# Patient Record
Sex: Female | Born: 1945 | Race: White | Hispanic: No | Smoking: Never smoker
Health system: Southern US, Community
[De-identification: ages and names within clinical notes are randomized; demographics above are authoritative.]

## PROBLEM LIST (undated history)

## (undated) DIAGNOSIS — I10 Essential (primary) hypertension: Secondary | ICD-10-CM

## (undated) DIAGNOSIS — T8859XA Other complications of anesthesia, initial encounter: Secondary | ICD-10-CM

## (undated) DIAGNOSIS — T4145XA Adverse effect of unspecified anesthetic, initial encounter: Secondary | ICD-10-CM

## (undated) DIAGNOSIS — I8390 Asymptomatic varicose veins of unspecified lower extremity: Secondary | ICD-10-CM

## (undated) DIAGNOSIS — M1990 Unspecified osteoarthritis, unspecified site: Secondary | ICD-10-CM

## (undated) HISTORY — PX: WISDOM TOOTH EXTRACTION: SHX21

## (undated) HISTORY — PX: APPENDECTOMY: SHX54

## (undated) HISTORY — PX: CATARACT EXTRACTION, BILATERAL: SHX1313

## (undated) HISTORY — PX: TUBAL LIGATION: SHX77

---

## 2001-02-08 ENCOUNTER — Inpatient Hospital Stay (HOSPITAL_COMMUNITY): Admission: AD | Admit: 2001-02-08 | Discharge: 2001-02-09 | Payer: Self-pay | Admitting: Cardiovascular Disease

## 2001-02-09 ENCOUNTER — Encounter: Payer: Self-pay | Admitting: Cardiovascular Disease

## 2010-08-16 ENCOUNTER — Other Ambulatory Visit: Payer: Self-pay | Admitting: Family Medicine

## 2010-08-16 DIAGNOSIS — Z1231 Encounter for screening mammogram for malignant neoplasm of breast: Secondary | ICD-10-CM

## 2010-08-26 ENCOUNTER — Ambulatory Visit
Admission: RE | Admit: 2010-08-26 | Discharge: 2010-08-26 | Disposition: A | Discharge status: Home / Self Care | Payer: Medicare Other | Source: Ambulatory Visit | Attending: Family Medicine | Admitting: Family Medicine

## 2010-08-26 DIAGNOSIS — Z1231 Encounter for screening mammogram for malignant neoplasm of breast: Secondary | ICD-10-CM

## 2013-05-03 ENCOUNTER — Encounter (HOSPITAL_COMMUNITY): Payer: Self-pay | Admitting: Emergency Medicine

## 2013-05-03 ENCOUNTER — Emergency Department (HOSPITAL_COMMUNITY): Payer: Medicare Other

## 2013-05-03 ENCOUNTER — Emergency Department (HOSPITAL_COMMUNITY)
Admission: EM | Admit: 2013-05-03 | Discharge: 2013-05-03 | Disposition: A | Discharge status: Home / Self Care | Payer: Medicare Other | Attending: Emergency Medicine | Admitting: Emergency Medicine

## 2013-05-03 DIAGNOSIS — H8110 Benign paroxysmal vertigo, unspecified ear: Secondary | ICD-10-CM | POA: Insufficient documentation

## 2013-05-03 DIAGNOSIS — I10 Essential (primary) hypertension: Secondary | ICD-10-CM | POA: Insufficient documentation

## 2013-05-03 DIAGNOSIS — Z7982 Long term (current) use of aspirin: Secondary | ICD-10-CM | POA: Insufficient documentation

## 2013-05-03 DIAGNOSIS — Z79899 Other long term (current) drug therapy: Secondary | ICD-10-CM | POA: Insufficient documentation

## 2013-05-03 DIAGNOSIS — H9319 Tinnitus, unspecified ear: Secondary | ICD-10-CM | POA: Insufficient documentation

## 2013-05-03 DIAGNOSIS — M6281 Muscle weakness (generalized): Secondary | ICD-10-CM | POA: Insufficient documentation

## 2013-05-03 HISTORY — DX: Essential (primary) hypertension: I10

## 2013-05-03 LAB — CBC
HEMATOCRIT: 36.7 % (ref 36.0–46.0)
HEMOGLOBIN: 12.1 g/dL (ref 12.0–15.0)
MCH: 29.6 pg (ref 26.0–34.0)
MCHC: 33 g/dL (ref 30.0–36.0)
MCV: 89.7 fL (ref 78.0–100.0)
Platelets: 285 10*3/uL (ref 150–400)
RBC: 4.09 MIL/uL (ref 3.87–5.11)
RDW: 12.7 % (ref 11.5–15.5)
WBC: 6.6 10*3/uL (ref 4.0–10.5)

## 2013-05-03 LAB — BASIC METABOLIC PANEL
BUN: 12 mg/dL (ref 6–23)
CO2: 24 mEq/L (ref 19–32)
Calcium: 9.7 mg/dL (ref 8.4–10.5)
Chloride: 105 mEq/L (ref 96–112)
Creatinine, Ser: 0.83 mg/dL (ref 0.50–1.10)
GFR calc Af Amer: 82 mL/min — ABNORMAL LOW (ref >90)
GFR calc non Af Amer: 70 mL/min — ABNORMAL LOW (ref >90)
Glucose, Bld: 94 mg/dL (ref 70–99)
Potassium: 4 mEq/L (ref 3.7–5.3)
Sodium: 142 mEq/L (ref 137–147)

## 2013-05-03 LAB — I-STAT TROPONIN, ED: Troponin i, poc: 0 ng/mL (ref 0.00–0.08)

## 2013-05-03 MED ORDER — MECLIZINE HCL 25 MG PO TABS: 25.0000 mg | ORAL_TABLET | Freq: Three times a day (TID) | ORAL | Status: DC | PRN

## 2013-05-03 MED ORDER — MECLIZINE HCL 25 MG PO TABS
25.0000 mg | ORAL_TABLET | Freq: Once | ORAL | Status: AC
  Administered 2013-05-03: 25 mg via ORAL
  Filled 2013-05-03: qty 1

## 2013-05-03 NOTE — ED Notes (Signed)
Pt ambulating independently w/ steady gait on d/c in no acute distress, A&Ox4. D/c instructions reviewed w/ pt and family - pt and family deny any further questions or concerns at present. Rx given x1  

## 2013-05-03 NOTE — ED Notes (Signed)
Pt reports that left ear is hurting her, and she has been feeling dizzy. States that she woke up last night with the pain and had weakness on the left side. States she feels like she is off balance.

## 2013-05-03 NOTE — ED Notes (Signed)
MD at bedside. 

## 2013-05-03 NOTE — ED Provider Notes (Signed)
CSN: [id number]     Arrival date & time 05/03/13  1515 History   First MD Initiated Contact with Patient 05/03/13 1801     Chief Complaint  Patient presents with  . Dizziness     (Consider location/radiation/quality/duration/timing/severity/associated sxs/prior Treatment) HPI Comments: Pt with cc: of dizziness. States last night she woke up and room was spinning. Still able to walk. Felt better, and then wen tback to bed. Woke up today and states she has had off and on dizziness, also has had some L ear ringing. Pt states she feels a little off balance and "weak" but not focally. No sensation deficits. Still able to walk. No hx of prior. No vision troubles.   Patient is a 68 y.o. female presenting with dizziness. The history is provided by the patient.  Dizziness Quality:  Vertigo Severity:  Mild Onset quality:  Sudden Duration:  1 day Timing:  Intermittent Progression:  Waxing and waning Chronicity:  New Context: ear pain and standing up   Context: not when bending over, not with head movement and not with inactivity   Relieved by:  Nothing Worsened by:  Standing up Ineffective treatments:  None tried Associated symptoms: tinnitus and weakness (feels a little weak on L side)   Associated symptoms: no blood in stool, no chest pain, no headaches, no nausea, no shortness of breath, no syncope and no vomiting   Risk factors: no hx of stroke, no hx of vertigo and no new medications     Past Medical History  Diagnosis Date  . Hypertension    Past Surgical History  Procedure Laterality Date  . Appendectomy     No family history on file. History  Substance Use Topics  . Smoking status: Never Smoker   . Smokeless tobacco: Not on file  . Alcohol Use: No   OB History   Grav Para Term Preterm Abortions TAB SAB Ect Mult Living                 Review of Systems  Constitutional: Negative for fever, activity change and appetite change.  HENT: Positive for tinnitus. Negative for  congestion and rhinorrhea.   Eyes: Negative for discharge, redness and itching.  Respiratory: Negative for shortness of breath.   Cardiovascular: Negative for chest pain and syncope.  Gastrointestinal: Negative for nausea, vomiting and blood in stool.  Skin: Negative for rash.  Neurological: Positive for dizziness and weakness. Negative for seizures, syncope, numbness and headaches.      Allergies  Review of patient's allergies indicates no known allergies.  Home Medications   Current Outpatient Rx  Name  Route  Sig  Dispense  Refill  . BAYER ASPIRIN PO   Oral   Take 1 tablet by mouth daily. Total of 800mg          . furosemide (LASIX) 20 MG tablet   Oral   Take 20 mg by mouth daily as needed.         Marland Kitchen levothyroxine (SYNTHROID, LEVOTHROID) 75 MCG tablet   Oral   Take 75 mcg by mouth daily before breakfast.         . metoprolol (LOPRESSOR) 100 MG tablet   Oral   Take 100 mg by mouth daily.         . Omega-3 Fatty Acids (OMEGA 3 PO)   Oral   Take by mouth.         . meclizine (ANTIVERT) 25 MG tablet   Oral   Take 1 tablet (  25 mg total) by mouth 3 (three) times daily as needed for dizziness.   60 tablet   0    BP 155/69  Pulse 58  Temp(Src) 98.1 F (36.7 C) (Oral)  Resp 16  Wt 227 lb 3 oz (103.052 kg)  SpO2 100% Physical Exam  Constitutional: She is oriented to person, place, and time. She appears well-developed and well-nourished. No distress.  HENT:  Head: Normocephalic and atraumatic.  Mouth/Throat: Oropharynx is clear and moist.  Eyes: Conjunctivae and EOM are normal. Pupils are equal, round, and reactive to light. Right eye exhibits no discharge. Left eye exhibits no discharge. No scleral icterus.  Neck: Normal range of motion. Neck supple.  Cardiovascular: Normal rate, regular rhythm and intact distal pulses.  Exam reveals no gallop and no friction rub.   No murmur heard. Pulmonary/Chest: Effort normal and breath sounds normal. No respiratory  distress. She has no wheezes. She has no rales.  Abdominal: Soft. She exhibits no distension and no mass. There is no tenderness.  Musculoskeletal: Normal range of motion.  Neurological: She is alert and oriented to person, place, and time. No cranial nerve deficit. She exhibits normal muscle tone. Coordination normal.  5/5 strength in all exts, normal sensation in all exts, 2+ DTRs in patella and brachioradilias b/l, F2N negative, Romberg negative, able to ambulate without ataxia  Skin: She is not diaphoretic.    ED Course  Procedures (including critical care time) Labs Review Labs Reviewed  BASIC METABOLIC PANEL - Abnormal; Notable for the following:    GFR calc non Af Amer 70 (*)    GFR calc Af Amer 82 (*)    All other components within normal limits  CBC  I-STAT TROPOININ, ED   Imaging Review Ct Head Wo Contrast  05/03/2013   CLINICAL DATA:  Feeling dizzy and left ear is hurting.  EXAM: CT HEAD WITHOUT CONTRAST  TECHNIQUE: Contiguous axial images were obtained from the base of the skull through the vertex without contrast.  COMPARISON:  None  FINDINGS: The cerebellar tonsils appear to be low-lying and this could represent a Chiari 1 malformation. Otherwise, normal appearance of the intracranial structures. No evidence for acute hemorrhage, mass lesion, midline shift, hydrocephalus or large infarct. The visualized sinuses are clear. No acute bone abnormality.  IMPRESSION: No acute intracranial abnormality.  The cerebellar tonsils may be low lying and a Chiari 1 malformation cannot be excluded. This could be further evaluated with MRI.   Electronically Signed   By: Richarda Overlie M.D.   On: 05/03/2013 16:40   Mr Brain Wo Contrast  05/03/2013   CLINICAL DATA:  Dizziness starting today  EXAM: MRI HEAD WITHOUT CONTRAST  TECHNIQUE: Multiplanar, multiecho pulse sequences of the brain and surrounding structures were obtained without intravenous contrast.  COMPARISON:  05/03/2013  FINDINGS: Calvarium  and upper cervical spine: No marrow signal abnormality. No findings in the temporal bone to explain dizziness.  Orbits: Bilateral cataract resection.  Sinuses: Clear. Mastoid and middle ears are clear.  Brain: No acute abnormality such as acute infarct (a punctate DWI hyperintensity in the subcortical right frontal region is shine through based on ADC map), hemorrhage, hydrocephalus, or mass lesion. No evidence of large vessel occlusion.  The cerebellar tonsils project 6 mm per the foramen magnum, but there is no significant crowding. Few scattered T2 and FLAIR hyperintensities in the bilateral cerebral white matter, compatible with remote small vessel ischemic injuries, common for age.  IMPRESSION: Negative brain MRI.  No findings to  explain dizziness.   Electronically Signed   By: Tiburcio PeaJonathan  Watts M.D.   On: 05/03/2013 21:40     EKG Interpretation None      MDM   MDM: 68 y.o. AAF w/ PMHx of HTN w/ cc: of vertigo. Sudden onset last night and intermittent since then. Also with feeling off balance, mild subj weakness. Currently with some dizziness. AFVSS, well appaering, NAD, no neuro deficits. Able to ambulate. With vertigo in 68 y.o. AAF w/ risk factors, will check labs, and MRI. Labs WNL. Pt given Antivert. MRI shows no acute process, specifically, no stroke, no mass. Pt with resolution of sxs with Antivert. Likely benign process. Will discharge with Antivert rx and to follow up with PCP as needed. Discharged. Care of case d/w my attending.  Final diagnoses:  BPPV (benign paroxysmal positional vertigo)    Discharged   Pilar Jarvisoug Kurk Corniel, MD 05/03/13 2330

## 2013-05-03 NOTE — Discharge Instructions (Signed)

## 2013-05-08 NOTE — ED Provider Notes (Signed)
I saw and evaluated the patient, reviewed the resident's note and I agree with the findings and plan.   EKG Interpretation   Date/Time:  Friday May 03 2013 15:36:09 EDT Ventricular Rate:  56 PR Interval:  138 QRS Duration: 94 QT Interval:  428 QTC Calculation: 413 R Axis:   47 Text Interpretation:  Sinus bradycardia Nonspecific ST abnormality  Abnormal ECG ED PHYSICIAN INTERPRETATION AVAILABLE IN [facility]  Confirmed by TEST, Record ([id number]) on 05/05/2013 12:51:19 PM     Patient with vertiginous symptoms that are mild in nature. She has had resolution with meclizine here in the ER and her workup is negative. Discharge with close followup with PCP.   Gilda Creasehristopher J. Pollina, MD 05/08/13 [id number]

## 2013-08-05 ENCOUNTER — Other Ambulatory Visit: Payer: Self-pay | Admitting: Internal Medicine

## 2013-08-05 DIAGNOSIS — R1900 Intra-abdominal and pelvic swelling, mass and lump, unspecified site: Secondary | ICD-10-CM

## 2013-08-08 ENCOUNTER — Ambulatory Visit
Admission: RE | Admit: 2013-08-08 | Discharge: 2013-08-08 | Disposition: A | Discharge status: Home / Self Care | Payer: Medicare Other | Source: Ambulatory Visit | Attending: Internal Medicine | Admitting: Internal Medicine

## 2013-08-08 DIAGNOSIS — R1900 Intra-abdominal and pelvic swelling, mass and lump, unspecified site: Secondary | ICD-10-CM

## 2013-09-09 ENCOUNTER — Encounter (HOSPITAL_COMMUNITY): Payer: Self-pay | Admitting: Pharmacist

## 2013-09-10 ENCOUNTER — Encounter (HOSPITAL_COMMUNITY)
Admission: RE | Admit: 2013-09-10 | Discharge: 2013-09-10 | Disposition: A | Discharge status: Home / Self Care | Payer: Medicare Other | Source: Ambulatory Visit | Attending: Obstetrics & Gynecology | Admitting: Obstetrics & Gynecology

## 2013-09-10 ENCOUNTER — Encounter (HOSPITAL_COMMUNITY): Payer: Self-pay

## 2013-09-10 ENCOUNTER — Other Ambulatory Visit: Payer: Self-pay | Admitting: Obstetrics & Gynecology

## 2013-09-10 DIAGNOSIS — R9389 Abnormal findings on diagnostic imaging of other specified body structures: Secondary | ICD-10-CM | POA: Diagnosis not present

## 2013-09-10 DIAGNOSIS — N8501 Benign endometrial hyperplasia: Secondary | ICD-10-CM | POA: Diagnosis not present

## 2013-09-10 DIAGNOSIS — I8390 Asymptomatic varicose veins of unspecified lower extremity: Secondary | ICD-10-CM | POA: Diagnosis not present

## 2013-09-10 DIAGNOSIS — N840 Polyp of corpus uteri: Secondary | ICD-10-CM | POA: Diagnosis not present

## 2013-09-10 DIAGNOSIS — I10 Essential (primary) hypertension: Secondary | ICD-10-CM | POA: Diagnosis not present

## 2013-09-10 HISTORY — DX: Unspecified osteoarthritis, unspecified site: M19.90

## 2013-09-10 HISTORY — DX: Adverse effect of unspecified anesthetic, initial encounter: T41.45XA

## 2013-09-10 HISTORY — DX: Other complications of anesthesia, initial encounter: T88.59XA

## 2013-09-10 HISTORY — DX: Asymptomatic varicose veins of unspecified lower extremity: I83.90

## 2013-09-10 LAB — CBC
HEMATOCRIT: 36.5 % (ref 36.0–46.0)
HEMOGLOBIN: 12 g/dL (ref 12.0–15.0)
MCH: 29.7 pg (ref 26.0–34.0)
MCHC: 32.9 g/dL (ref 30.0–36.0)
MCV: 90.3 fL (ref 78.0–100.0)
Platelets: 281 10*3/uL (ref 150–400)
RBC: 4.04 MIL/uL (ref 3.87–5.11)
RDW: 12.7 % (ref 11.5–15.5)
WBC: 8.5 10*3/uL (ref 4.0–10.5)

## 2013-09-10 LAB — BASIC METABOLIC PANEL
Anion gap: 10 (ref 5–15)
BUN: 17 mg/dL (ref 6–23)
CALCIUM: 9.4 mg/dL (ref 8.4–10.5)
CO2: 24 meq/L (ref 19–32)
CREATININE: 0.94 mg/dL (ref 0.50–1.10)
Chloride: 105 mEq/L (ref 96–112)
GFR calc Af Amer: 70 mL/min — ABNORMAL LOW (ref >90)
GFR calc non Af Amer: 61 mL/min — ABNORMAL LOW (ref >90)
GLUCOSE: 101 mg/dL — AB (ref 70–99)
Potassium: 4.5 mEq/L (ref 3.7–5.3)
Sodium: 139 mEq/L (ref 137–147)

## 2013-09-10 NOTE — Patient Instructions (Addendum)
Your procedure is scheduled on: Wednesday, September 11, 2013  [facility]: 9:00am  Pick up the phone at the desk and dial [phone].  Call this number if you have problems the morning of surgery: [phone].  Remember: Do NOT eat food: After midnight tonight Do NOT drink clear liquids after: After midnight tonight Take these medicines the morning of surgery with a SIP OF WATER: Amlodipine, Benicar  Do NOT wear jewelry (body piercing), metal hair clips/bobby pins, make-up, or nail polish. Do NOT wear lotions, powders, or perfumes.  You may wear deoderant. Do NOT shave for 48 hours prior to surgery. Do NOT bring valuables to the hospital. Contacts, dentures, or bridgework may not be worn into surgery. Have a responsible adult drive you home and stay with you for 24 hours after your procedure

## 2013-09-11 ENCOUNTER — Encounter (HOSPITAL_COMMUNITY): Payer: Self-pay | Admitting: Anesthesiology

## 2013-09-11 ENCOUNTER — Ambulatory Visit (HOSPITAL_COMMUNITY)
Admission: RE | Admit: 2013-09-11 | Discharge: 2013-09-11 | Disposition: A | Discharge status: Home / Self Care | Payer: Medicare Other | Source: Ambulatory Visit | Attending: Obstetrics & Gynecology | Admitting: Obstetrics & Gynecology

## 2013-09-11 ENCOUNTER — Encounter (HOSPITAL_COMMUNITY)
Admission: RE | Disposition: A | Discharge status: Home / Self Care | Payer: Self-pay | Source: Ambulatory Visit | Attending: Obstetrics & Gynecology

## 2013-09-11 ENCOUNTER — Ambulatory Visit (HOSPITAL_COMMUNITY): Payer: Medicare Other | Admitting: Anesthesiology

## 2013-09-11 ENCOUNTER — Encounter (HOSPITAL_COMMUNITY): Payer: Medicare Other | Admitting: Anesthesiology

## 2013-09-11 DIAGNOSIS — I10 Essential (primary) hypertension: Secondary | ICD-10-CM | POA: Diagnosis not present

## 2013-09-11 DIAGNOSIS — N8501 Benign endometrial hyperplasia: Secondary | ICD-10-CM

## 2013-09-11 DIAGNOSIS — N840 Polyp of corpus uteri: Secondary | ICD-10-CM | POA: Insufficient documentation

## 2013-09-11 DIAGNOSIS — R9389 Abnormal findings on diagnostic imaging of other specified body structures: Secondary | ICD-10-CM | POA: Diagnosis not present

## 2013-09-11 DIAGNOSIS — I8390 Asymptomatic varicose veins of unspecified lower extremity: Secondary | ICD-10-CM | POA: Insufficient documentation

## 2013-09-11 HISTORY — PX: HYSTEROSCOPY W/D&C: SHX1775

## 2013-09-11 SURGERY — DILATATION AND CURETTAGE /HYSTEROSCOPY
Anesthesia: General | Site: Uterus

## 2013-09-11 MED ORDER — CEFAZOLIN SODIUM-DEXTROSE 2-3 GM-% IV SOLR
2.0000 g | INTRAVENOUS | Status: AC
  Administered 2013-09-11: 2 g via INTRAVENOUS

## 2013-09-11 MED ORDER — SCOPOLAMINE 1 MG/3DAYS TD PT72
MEDICATED_PATCH | TRANSDERMAL | Status: AC
  Administered 2013-09-11: 1.5 mg via TRANSDERMAL
  Filled 2013-09-11: qty 1

## 2013-09-11 MED ORDER — DEXAMETHASONE SODIUM PHOSPHATE 10 MG/ML IJ SOLN
INTRAMUSCULAR | Status: AC
  Filled 2013-09-11: qty 1

## 2013-09-11 MED ORDER — GLYCOPYRROLATE 0.2 MG/ML IJ SOLN
INTRAMUSCULAR | Status: AC
  Filled 2013-09-11: qty 1

## 2013-09-11 MED ORDER — PROPOFOL 10 MG/ML IV BOLUS
INTRAVENOUS | Status: DC | PRN
  Administered 2013-09-11: 200 mg via INTRAVENOUS

## 2013-09-11 MED ORDER — LACTATED RINGERS IV SOLN
INTRAVENOUS | Status: DC
  Administered 2013-09-11 (×2): via INTRAVENOUS

## 2013-09-11 MED ORDER — ONDANSETRON HCL 4 MG/2ML IJ SOLN
INTRAMUSCULAR | Status: DC | PRN
  Administered 2013-09-11: 4 mg via INTRAVENOUS

## 2013-09-11 MED ORDER — LIDOCAINE HCL (CARDIAC) 20 MG/ML IV SOLN
INTRAVENOUS | Status: AC
  Filled 2013-09-11: qty 5

## 2013-09-11 MED ORDER — PROPOFOL 10 MG/ML IV EMUL
INTRAVENOUS | Status: AC
  Filled 2013-09-11: qty 20

## 2013-09-11 MED ORDER — DEXAMETHASONE SODIUM PHOSPHATE 10 MG/ML IJ SOLN
INTRAMUSCULAR | Status: DC | PRN
  Administered 2013-09-11: 4 mg via INTRAVENOUS

## 2013-09-11 MED ORDER — ONDANSETRON HCL 4 MG/2ML IJ SOLN
INTRAMUSCULAR | Status: AC
  Filled 2013-09-11: qty 2

## 2013-09-11 MED ORDER — FENTANYL CITRATE 0.05 MG/ML IJ SOLN: 25.0000 ug | INTRAMUSCULAR | Status: DC | PRN

## 2013-09-11 MED ORDER — ONDANSETRON HCL 4 MG/2ML IJ SOLN
4.0000 mg | Freq: Once | INTRAMUSCULAR | Status: DC | PRN
Start: 2013-09-11 — End: 2013-09-11

## 2013-09-11 MED ORDER — LIDOCAINE HCL 1 % IJ SOLN
INTRAMUSCULAR | Status: DC | PRN
Start: 2013-09-11 — End: 2013-09-11
  Administered 2013-09-11: 10 mL

## 2013-09-11 MED ORDER — SCOPOLAMINE 1 MG/3DAYS TD PT72
1.0000 | MEDICATED_PATCH | Freq: Once | TRANSDERMAL | Status: DC
  Administered 2013-09-11: 1.5 mg via TRANSDERMAL

## 2013-09-11 MED ORDER — MIDAZOLAM HCL 5 MG/5ML IJ SOLN
INTRAMUSCULAR | Status: DC | PRN
  Administered 2013-09-11: 1 mg via INTRAVENOUS

## 2013-09-11 MED ORDER — MIDAZOLAM HCL 2 MG/2ML IJ SOLN
INTRAMUSCULAR | Status: AC
  Filled 2013-09-11: qty 2

## 2013-09-11 MED ORDER — MEPERIDINE HCL 25 MG/ML IJ SOLN
6.2500 mg | INTRAMUSCULAR | Status: DC | PRN
Start: 2013-09-11 — End: 2013-09-11

## 2013-09-11 MED ORDER — FENTANYL CITRATE 0.05 MG/ML IJ SOLN
INTRAMUSCULAR | Status: DC | PRN
  Administered 2013-09-11: 100 ug via INTRAVENOUS

## 2013-09-11 MED ORDER — FENTANYL CITRATE 0.05 MG/ML IJ SOLN
INTRAMUSCULAR | Status: AC
  Filled 2013-09-11: qty 5

## 2013-09-11 MED ORDER — SODIUM CHLORIDE 0.9 % IR SOLN
Status: DC | PRN
  Administered 2013-09-11: 3000 mL

## 2013-09-11 MED ORDER — ACETAMINOPHEN 500 MG PO TABS: 500.0000 mg | ORAL_TABLET | Freq: Four times a day (QID) | ORAL | Status: AC | PRN

## 2013-09-11 MED ORDER — LIDOCAINE HCL (CARDIAC) 20 MG/ML IV SOLN
INTRAVENOUS | Status: DC | PRN
  Administered 2013-09-11: 50 mg via INTRAVENOUS

## 2013-09-11 MED ORDER — KETOROLAC TROMETHAMINE 30 MG/ML IJ SOLN: 15.0000 mg | Freq: Once | INTRAMUSCULAR | Status: DC | PRN

## 2013-09-11 MED ORDER — CEFAZOLIN SODIUM-DEXTROSE 2-3 GM-% IV SOLR
INTRAVENOUS | Status: AC
  Filled 2013-09-11: qty 50

## 2013-09-11 MED ORDER — GLYCOPYRROLATE 0.2 MG/ML IJ SOLN
INTRAMUSCULAR | Status: DC | PRN
  Administered 2013-09-11: 0.2 mg via INTRAVENOUS

## 2013-09-11 MED ORDER — LIDOCAINE HCL 1 % IJ SOLN
INTRAMUSCULAR | Status: AC
  Filled 2013-09-11: qty 20

## 2013-09-11 SURGICAL SUPPLY — 23 items
CANISTER SUCT 3000ML (MISCELLANEOUS) ×3 IMPLANT
CATH ROBINSON RED A/P 16FR (CATHETERS) ×3 IMPLANT
CLOTH BEACON ORANGE TIMEOUT ST (SAFETY) ×3 IMPLANT
CONTAINER PREFILL 10% NBF 60ML (FORM) ×4 IMPLANT
DECANTER SPIKE VIAL GLASS SM (MISCELLANEOUS) ×2 IMPLANT
DRAPE HYSTEROSCOPY (DRAPE) ×3 IMPLANT
ELECT REM PT RETURN 9FT ADLT (ELECTROSURGICAL) ×3
ELECTRODE REM PT RTRN 9FT ADLT (ELECTROSURGICAL) ×1 IMPLANT
GLOVE BIO SURGEON STRL SZ7 (GLOVE) ×3 IMPLANT
GLOVE BIOGEL PI IND STRL 7.0 (GLOVE) ×2 IMPLANT
GLOVE BIOGEL PI IND STRL 8 (GLOVE) IMPLANT
GLOVE BIOGEL PI INDICATOR 7.0 (GLOVE) ×4
GLOVE BIOGEL PI INDICATOR 8 (GLOVE) ×4
GLOVE SURG SS PI 7.0 STRL IVOR (GLOVE) ×12 IMPLANT
GLOVE SURG SS PI 8.5 STRL IVOR (GLOVE) ×4
GLOVE SURG SS PI 8.5 STRL STRW (GLOVE) IMPLANT
GOWN STRL REUS W/TWL LRG LVL3 (GOWN DISPOSABLE) ×6 IMPLANT
PACK VAGINAL MINOR WOMEN LF (CUSTOM PROCEDURE TRAY) ×3 IMPLANT
PAD OB MATERNITY 4.3X12.25 (PERSONAL CARE ITEMS) ×3 IMPLANT
SET TUBING HYSTEROSCOPY 2 NDL (TUBING) ×2 IMPLANT
TOWEL OR 17X24 6PK STRL BLUE (TOWEL DISPOSABLE) ×6 IMPLANT
TUBE HYSTEROSCOPY W Y-CONNECT (TUBING) ×2 IMPLANT
WATER STERILE IRR 1000ML POUR (IV SOLUTION) ×3 IMPLANT

## 2013-09-11 NOTE — H&P (Addendum)
Jessica KetJean Atkinson is an [age] y.o. female female with thickened endometrium and possible endometrial mass noted incidentally on pelvic sonogram. Office endometrial biopsy noted fragments of complex endometrial hyperplasia. So patient is here for Hysteroscopy and D&C for better tissue collection and rule out endometrial cancer. Patient denies any vaginal bleeding/ pelvic pain. She is menopausal and never took any HRT or herbal estrogen supplements.   No LMP recorded. Patient is postmenopausal.   Past Medical History  Diagnosis Date  . Hypertension   . Varicose vein of leg   . Arthritis   . Complication of anesthesia     hard to wake up after anethesia   Past Surgical History  Procedure Laterality Date  . Appendectomy    . Tubal ligation    . Wisdom tooth extraction    . Cataract extraction, bilateral     No family history on file.  Social History:  reports that she has never smoked. She has never used smokeless tobacco. She reports that she does not drink alcohol or use illicit drugs.  Allergies: No Known Allergies  No prescriptions prior to admission   ROS neg   There were no vitals taken for this visit. Physical Exam  A&O x 3, no acute distress. Pleasant HEENT neg, no thyromegaly Lungs CTA bilat CV RRR, S1S2 normal Abdo soft, non tender, non acute Extr no edema/ tenderness Pelvic - cervix closed, long, normal uterus and adnexa.  Results for orders placed during the hospital encounter of 09/10/13 (from the past 24 hour(s))  CBC     Status: None   Collection Time    09/10/13  1:24 PM      Result Value Ref Range   WBC 8.5  4.0 - 10.5 K/uL   RBC 4.04  3.87 - 5.11 MIL/uL   Hemoglobin 12.0  12.0 - 15.0 g/dL   HCT 16.136.5  09.636.0 - 04.546.0 %   MCV 90.3  78.0 - 100.0 fL   MCH 29.7  26.0 - 34.0 pg   MCHC 32.9  30.0 - 36.0 g/dL   RDW 40.912.7  81.111.5 - 91.415.5 %   Platelets 281  150 - 400 K/uL  BASIC METABOLIC PANEL     Status: Abnormal   Collection Time    09/10/13  1:24 PM      Result Value  Ref Range   Sodium 139  137 - 147 mEq/L   Potassium 4.5  3.7 - 5.3 mEq/L   Chloride 105  96 - 112 mEq/L   CO2 24  19 - 32 mEq/L   Glucose, Bld 101 (*) 70 - 99 mg/dL   BUN 17  6 - 23 mg/dL   Creatinine, Ser 7.820.94  0.50 - 1.10 mg/dL   Calcium 9.4  8.4 - 95.610.5 mg/dL   GFR calc non Af Amer 61 (*) >90 mL/min   GFR calc Af Amer 70 (*) >90 mL/min   Anion gap 10  5 - 15    No results found.  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  Risks/complications of surgery reviewed incl infection, bleeding, damage to internal organs including bladder, bowels, ureters, blood vessels, other risks from anesthesia, VTE and delayed complications of any surgery, complications in future surgery reviewed.  Arlo Butt R 09/11/2013, 5:54 AM  UPDATE  reviewed H&P, plan, agrees, no changes V.Juliene PinaMody, MD

## 2013-09-11 NOTE — Progress Notes (Signed)
Dr Arby BarretteHatchett ordered the transderm patch to be removed from patient.

## 2013-09-11 NOTE — Discharge Instructions (Signed)
Hysteroscopy, Care After °Refer to this sheet in the next few weeks. These instructions provide you with information on caring for yourself after your procedure. Your health care provider may also give you more specific instructions. Your treatment has been planned according to current medical practices, but problems sometimes occur. Call your health care provider if you have any problems or questions after your procedure.  °WHAT TO EXPECT AFTER THE PROCEDURE °After your procedure, it is typical to have the following: °· You may have some cramping. This normally lasts for a couple days. °· You may have bleeding. This can vary from light spotting for a few days to menstrual-like bleeding for 3-7 days. °HOME CARE INSTRUCTIONS °· Rest for the first 1-2 days after the procedure. °· Only take over-the-counter or prescription medicines as directed by your health care provider. Do not take aspirin. It can increase the chances of bleeding. °· Take showers instead of baths for 2 weeks or as directed by your health care provider. °· Do not drive for 24 hours or as directed. °· Do not drink alcohol while taking pain medicine. °· Do not use tampons, douche, or have sexual intercourse for 2 weeks or until your health care provider says it is okay. °· Take your temperature twice a day for 4-5 days. Write it down each time. °· Follow your health care provider's advice about diet, exercise, and lifting. °· If you develop constipation, you may: °¨ Take a mild laxative if your health care provider approves. °¨ Add bran foods to your diet. °¨ Drink enough fluids to keep your urine clear or pale yellow. °· Try to have someone with you or available to you for the first 24-48 hours, especially if you were given a general anesthetic. °· Follow up with your health care provider as directed. °SEEK MEDICAL CARE IF: °· You feel dizzy or lightheaded. °· You feel sick to your stomach (nauseous). °· You have abnormal vaginal discharge. °· You  have a rash. °· You have pain that is not controlled with medicine. °SEEK IMMEDIATE MEDICAL CARE IF: °· You have bleeding that is heavier than a normal menstrual period. °· You have a fever. °· You have increasing cramps or pain, not controlled with medicine. °· You have new belly (abdominal) pain. °· You pass out. °· You have pain in the tops of your shoulders (shoulder strap areas). °· You have shortness of breath. °Document Released: 11/14/2012 Document Reviewed: 11/14/2012 °Exit[employer]® Patient Information ©2015 [employer]. This information is not intended to replace advice given to you by your health care provider. Make sure you discuss any questions you have with your health care provider. ° °

## 2013-09-11 NOTE — Op Note (Signed)
Preoperative diagnosis: Thick endometrial stripe, complex endometrial hyperplasia  Postop diagnosis: as above.  Procedure: Hysteroscopic polypectomy, D&C Anesthesia General via LMA  Surgeon: Shea EvansVaishali Rosha Cocker, MD  IV fluids : LR Estimated blood loss: minimal Fluid deficit (saline) : 40 cc   Urine output: straight catheter preop   Complications none  Condition stable  Disposition PACU  Specimen: endometrial polyp with endometrial curettings   Procedure  Indication: Thick endometrial stripe,suspect endometrial mass in menopausal female. Office endometrial biopsy noted complex endometrial hyperplasia without atypia. She procedure was planned to have more tissue sample.  Patient was counseled on risks/ complications including infection, bleeding, damage to internal organs, she understood and agrees, gave informed written consent.  Patient was brought to the operating room with IV running. Time out was carried out. She received preop 1 gm Ancef. She underwent general anesthesia via LMA without complications. She was given dorsolithotomy position. Parts were prepped and draped in standard fashion. Bladder was catheterized once. Bimanual exam revealed uterus to be anteverted and normal size. Speculum was placed and cervix was grasped with single-tooth tenaculum. Cervical block with 10 cc 1% plain Xylocaine given. The uterus was sounded to 8 cm. Cervical os was dilated to 21 Jamaica[place] dilator. Hysteroscope was introduced in the uterine cavity under vision, using Saline for irrigation.  Findings: fluffy endometrial tissue with a small polyp noted on the left wall below the cornua. Both tubal openings were normal, fundus was normal. Hysteroscopic polypectomy was performed with hysteroscopic scissors. Specimen sent to path. Hysteroscope was removed. Endometrial curettage was performed with sharp curette. All tissue sent to path.  Fluid deficit 40 cc.  All counts are correct x2. No complications. Patient was made  supine dorsal anesthesia and brought to the recovery room in stable condition.  Patient will be discharged home today.  Follow up in 2 weeks in office. Warning signs of infection and excessive bleeding reviewed.   V.Thaddeaus Monica, MD.

## 2013-09-11 NOTE — Anesthesia Postprocedure Evaluation (Signed)
  Anesthesia Post Note  Patient: Jessica Atkinson  Procedure(s) Performed: Procedure(s) (LRB): DILATATION AND CURETTAGE /HYSTEROSCOPY (N/A)  Anesthesia type: GA  Patient location: PACU  Post pain: Pain level controlled  Post assessment: Post-op Vital signs reviewed  Last Vitals:  Filed Vitals:   09/11/13 1230  BP: 127/58  Pulse: 54  Temp: 36.4 C  Resp: 19    Post vital signs: Reviewed  Level of consciousness: sedated  Complications: No apparent anesthesia complications

## 2013-09-11 NOTE — Anesthesia Preprocedure Evaluation (Signed)
Anesthesia Evaluation  Patient identified by MRN, date of birth, ID band Patient awake    Reviewed: Allergy & Precautions, H&P , NPO status , Patient's Chart, lab work & pertinent test results  Airway Mallampati: I TM Distance: >3 FB Neck ROM: full    Dental no notable dental hx. (+) Teeth Intact   Pulmonary neg pulmonary ROS,    Pulmonary exam normal       Cardiovascular hypertension, Pt. on medications     Neuro/Psych negative neurological ROS  negative psych ROS   GI/Hepatic Neg liver ROS,   Endo/Other  negative endocrine ROS  Renal/GU negative Renal ROS     Musculoskeletal   Abdominal Normal abdominal exam  (+)   Peds  Hematology negative hematology ROS (+)   Anesthesia Other Findings   Reproductive/Obstetrics negative OB ROS                           Anesthesia Physical Anesthesia Plan  ASA: II  Anesthesia Plan: General   Post-op Pain Management:    Induction: Intravenous  Airway Management Planned: LMA  Additional Equipment:   Intra-op Plan:   Post-operative Plan:   Informed Consent: I have reviewed the patients History and Physical, chart, labs and discussed the procedure including the risks, benefits and alternatives for the proposed anesthesia with the patient or authorized representative who has indicated his/her understanding and acceptance.     Plan Discussed with: CRNA and Surgeon  Anesthesia Plan Comments:         Anesthesia Quick Evaluation

## 2013-09-11 NOTE — Transfer of Care (Signed)
Immediate Anesthesia Transfer of Care Note  Patient: Jessica Atkinson  Procedure(s) Performed: Procedure(s): DILATATION AND CURETTAGE /HYSTEROSCOPY (N/A)  Patient Location: PACU  Anesthesia Type:General  Level of Consciousness: awake, alert , oriented and patient cooperative  Airway & Oxygen Therapy: Patient Spontanous Breathing and Patient connected to nasal cannula oxygen  Post-op Assessment: Report given to PACU RN, Post -op Vital signs reviewed and stable and Patient moving all extremities X 4  Post vital signs: Reviewed and stable  Complications: No apparent anesthesia complications

## 2013-09-12 ENCOUNTER — Encounter (HOSPITAL_COMMUNITY): Payer: Self-pay | Admitting: Obstetrics & Gynecology

## 2014-02-04 ENCOUNTER — Other Ambulatory Visit: Payer: Self-pay | Admitting: Internal Medicine

## 2014-02-04 DIAGNOSIS — R0989 Other specified symptoms and signs involving the circulatory and respiratory systems: Secondary | ICD-10-CM

## 2014-02-10 ENCOUNTER — Ambulatory Visit
Admission: RE | Admit: 2014-02-10 | Discharge: 2014-02-10 | Disposition: A | Discharge status: Home / Self Care | Payer: Medicare Other | Source: Ambulatory Visit | Attending: Internal Medicine | Admitting: Internal Medicine

## 2014-02-10 DIAGNOSIS — R0989 Other specified symptoms and signs involving the circulatory and respiratory systems: Secondary | ICD-10-CM

## 2015-05-08 IMAGING — CT CT HEAD W/O CM
1 series · 16 of 29 positions shown, 20 images · non-contrast
Comparison: None

CLINICAL DATA: Feeling dizzy and left ear is hurting.

EXAM:
CT HEAD WITHOUT CONTRAST
TECHNIQUE: Contiguous axial images were obtained from the base of the skull
through the vertex without contrast.

[Series 2: head 5.0 h30s · axial · 0.43mm/px · z∈[-34,+96]mm · 16 of 29 slices shown, 20 images]
[im 2/29  brain]
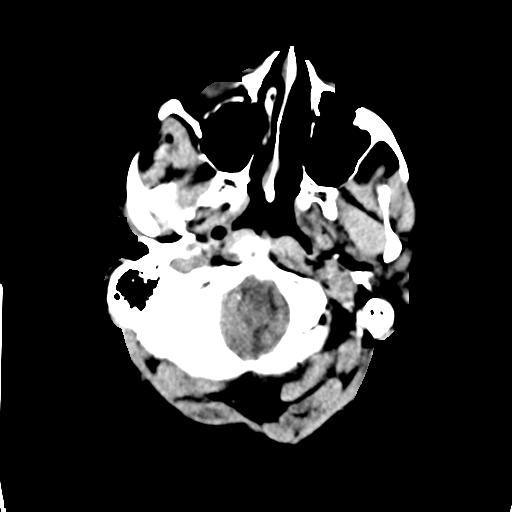
[im 2/29  bone]
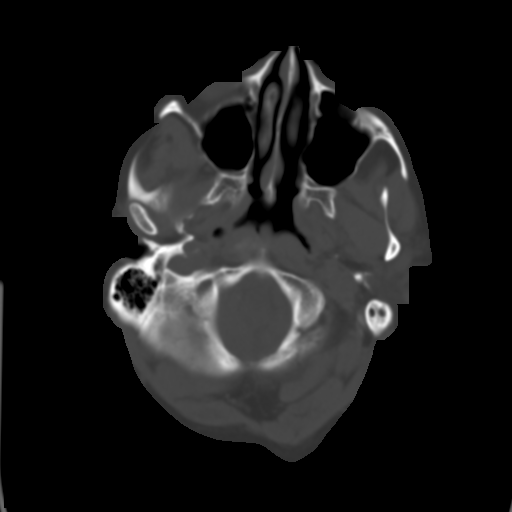
[im 4/29  brain]
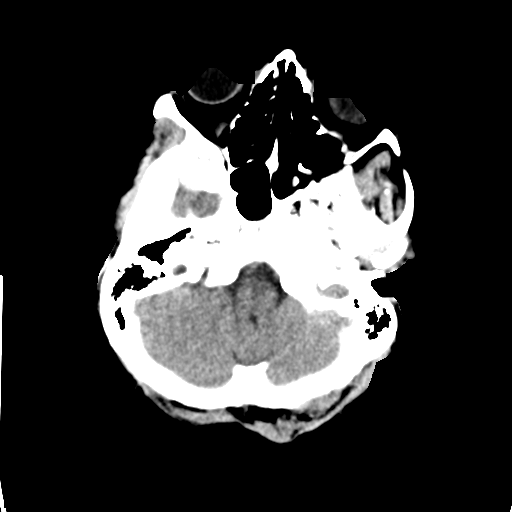
[im 6/29  brain]
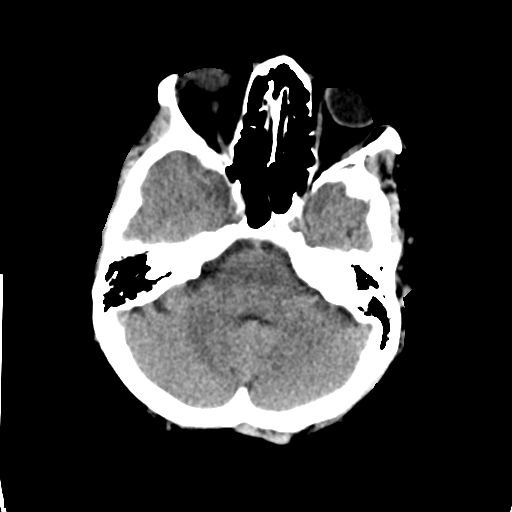
[im 7/29  brain]
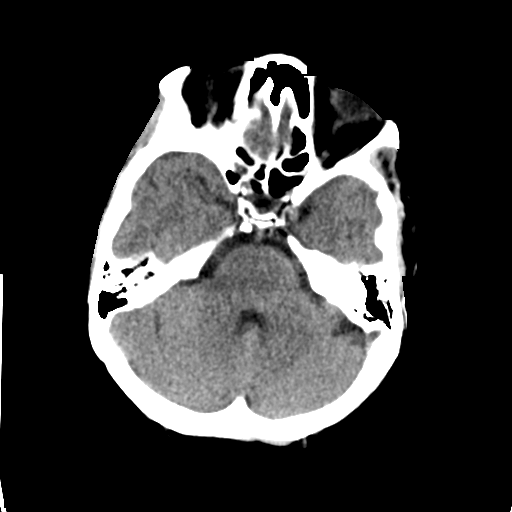
[im 9/29  brain]
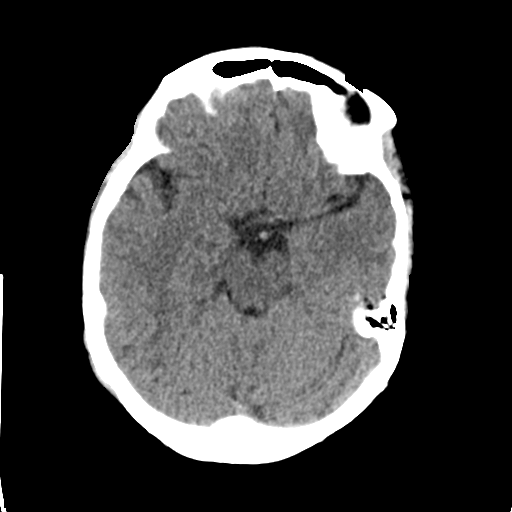
[im 9/29  bone]
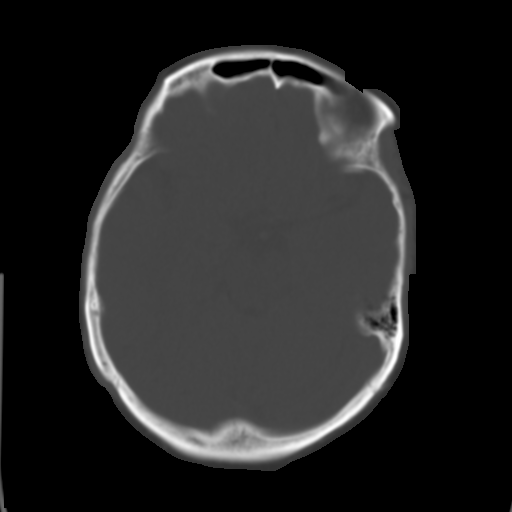
[im 11/29  brain]
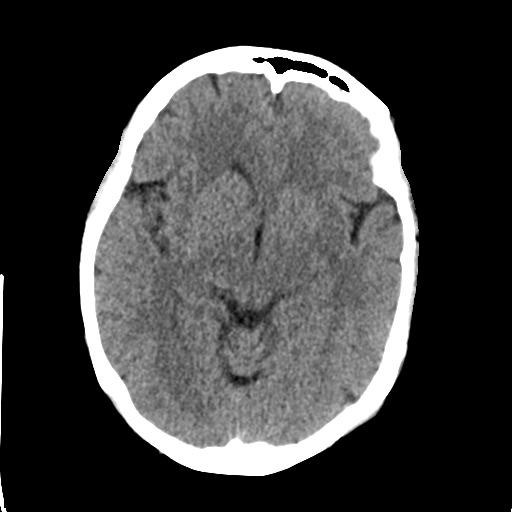
[im 12/29  brain]
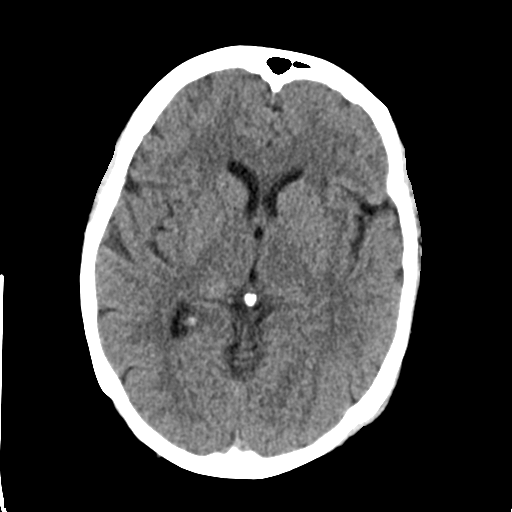
[im 14/29  brain]
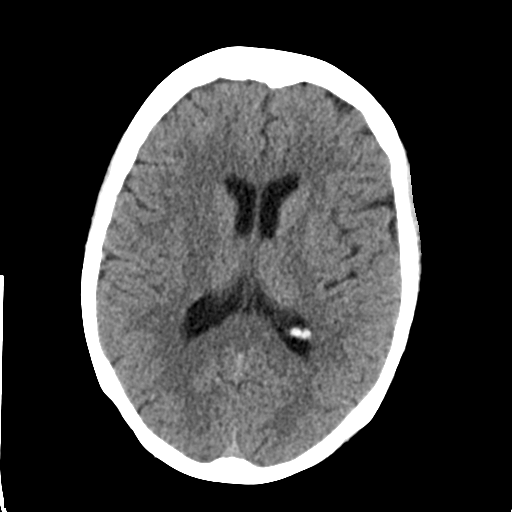
[im 16/29  brain]
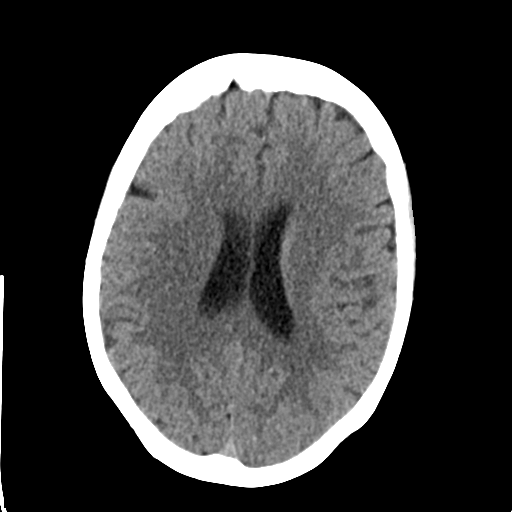
[im 16/29  bone]
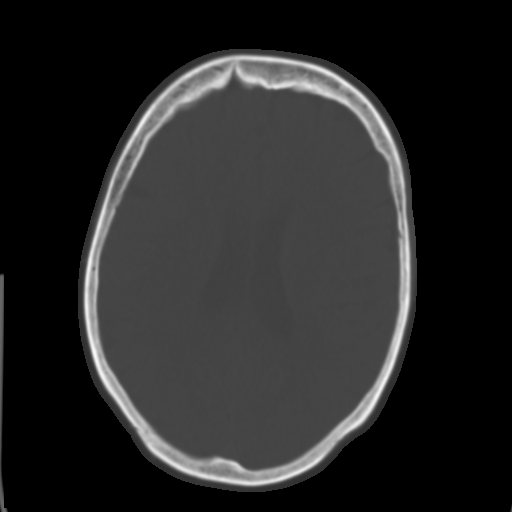
[im 18/29  brain]
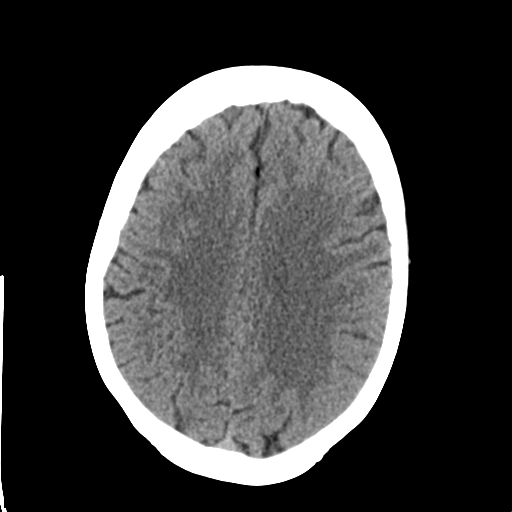
[im 19/29  brain]
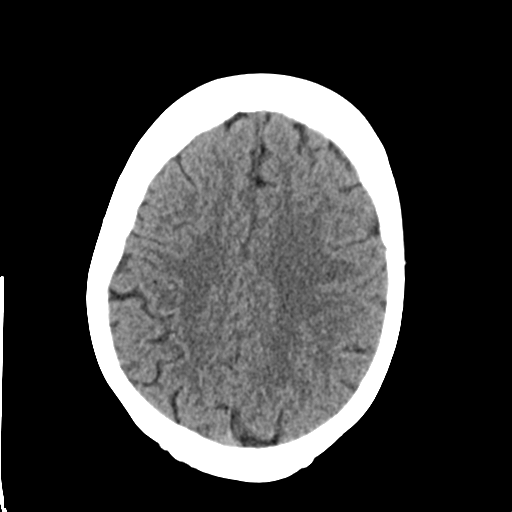
[im 21/29  brain]
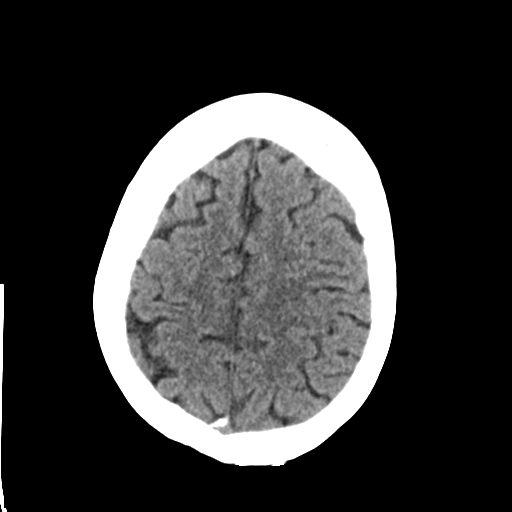
[im 23/29  brain]
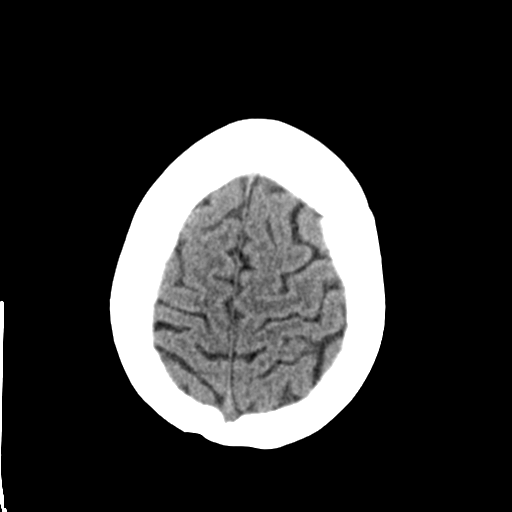
[im 23/29  bone]
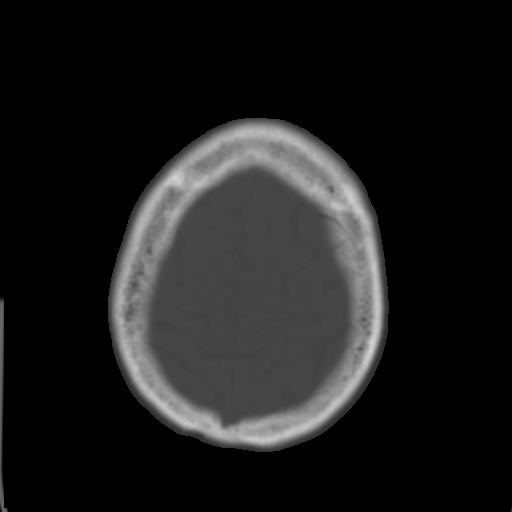
[im 24/29  brain]
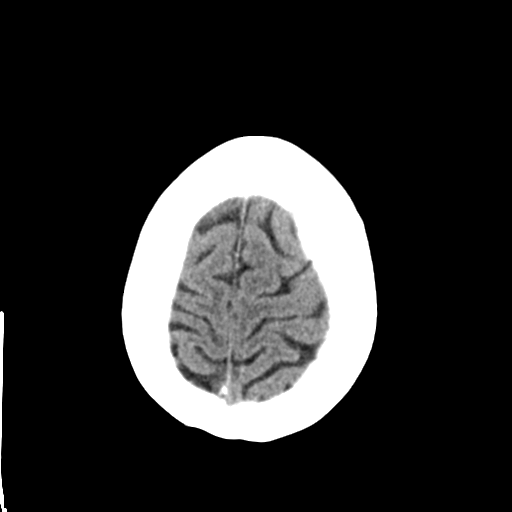
[im 26/29  brain]
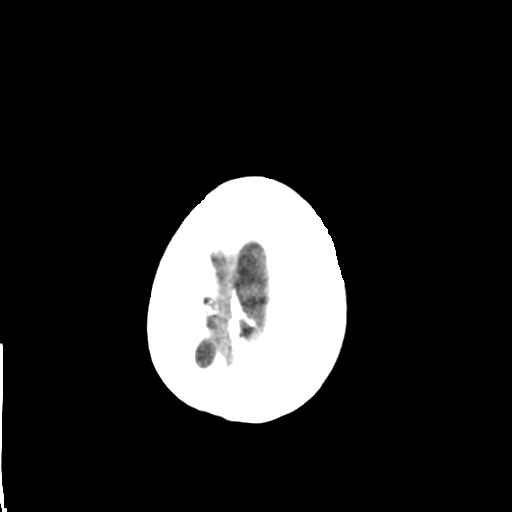
[im 28/29  brain]
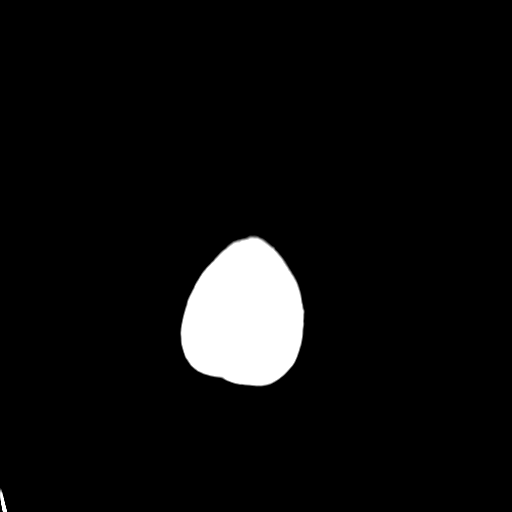

[16 of 29 positions shown; findings below may reference images not displayed]

FINDINGS: The cerebellar tonsils appear to be low-lying and this could
represent a Chiari 1 malformation. Otherwise, normal appearance of
the intracranial structures. No evidence for acute hemorrhage, mass
lesion, midline shift, hydrocephalus or large infarct. The
visualized sinuses are clear. No acute bone abnormality.
IMPRESSION: No acute intracranial abnormality.

The cerebellar tonsils may be low lying and a Chiari 1 malformation
cannot be excluded. This could be further evaluated with MRI.

## 2017-01-26 ENCOUNTER — Other Ambulatory Visit: Payer: Self-pay

## 2017-01-26 NOTE — Patient Outreach (Signed)
[facility] Care Management  01/26/2017  Jessica KetJean Atkinson 09-Apr-1945 [id number]   Medication Adherence call to Jessica Atkinson patient is showing past due under [facility]on Benicar 20 mg patient did not answer left a message for patient to call back ,call [facility] they said patient already pick up on 01/10/17 patient will be due on 02/2017.   Lillia AbedAna Ollison-Moran CPhT Pharmacy Technician [facility] Care Management Direct Dial [phone]  Fax [phone] [email or web address]

## 2017-05-31 ENCOUNTER — Other Ambulatory Visit: Payer: Self-pay | Admitting: Internal Medicine

## 2017-05-31 DIAGNOSIS — Z1231 Encounter for screening mammogram for malignant neoplasm of breast: Secondary | ICD-10-CM

## 2017-06-23 ENCOUNTER — Ambulatory Visit
Admission: RE | Admit: 2017-06-23 | Discharge: 2017-06-23 | Disposition: A | Discharge status: Home / Self Care | Payer: Medicare Other | Source: Ambulatory Visit | Attending: Internal Medicine | Admitting: Internal Medicine

## 2017-06-23 DIAGNOSIS — Z1231 Encounter for screening mammogram for malignant neoplasm of breast: Secondary | ICD-10-CM

## 2018-05-01 ENCOUNTER — Other Ambulatory Visit: Payer: Self-pay

## 2018-05-01 NOTE — Patient Outreach (Signed)
[facility]) Care Management  05/01/2018  LENDORA GERBITZ 1945-08-12 [id number]   Medication Adherence call to Mrs. Janeanne Papageorgiou spoke with patient but did not want to engage patient is showing past due on Benicar 40 mg [facility] said they have Benicar ready for patient to pick up. Mrs. Oconnor is showing past due under [facility] Ins.    Lillia Abed CPhT Pharmacy Technician [facility] Management Direct Dial [phone]  Fax [phone] [email or web address]

## 2019-03-01 DIAGNOSIS — I10 Essential (primary) hypertension: Secondary | ICD-10-CM | POA: Diagnosis not present

## 2019-03-01 DIAGNOSIS — Z780 Asymptomatic menopausal state: Secondary | ICD-10-CM | POA: Diagnosis not present

## 2019-03-01 DIAGNOSIS — E559 Vitamin D deficiency, unspecified: Secondary | ICD-10-CM | POA: Diagnosis not present

## 2019-03-01 DIAGNOSIS — E039 Hypothyroidism, unspecified: Secondary | ICD-10-CM | POA: Diagnosis not present

## 2019-03-01 DIAGNOSIS — M8580 Other specified disorders of bone density and structure, unspecified site: Secondary | ICD-10-CM | POA: Diagnosis not present

## 2019-03-15 DIAGNOSIS — E039 Hypothyroidism, unspecified: Secondary | ICD-10-CM | POA: Diagnosis not present

## 2019-03-15 DIAGNOSIS — E785 Hyperlipidemia, unspecified: Secondary | ICD-10-CM | POA: Diagnosis not present

## 2019-03-15 DIAGNOSIS — I10 Essential (primary) hypertension: Secondary | ICD-10-CM | POA: Diagnosis not present

## 2019-03-15 DIAGNOSIS — R7303 Prediabetes: Secondary | ICD-10-CM | POA: Diagnosis not present

## 2019-06-11 DIAGNOSIS — E785 Hyperlipidemia, unspecified: Secondary | ICD-10-CM | POA: Diagnosis not present

## 2019-06-11 DIAGNOSIS — E039 Hypothyroidism, unspecified: Secondary | ICD-10-CM | POA: Diagnosis not present

## 2019-06-11 DIAGNOSIS — I10 Essential (primary) hypertension: Secondary | ICD-10-CM | POA: Diagnosis not present

## 2019-06-11 DIAGNOSIS — R7303 Prediabetes: Secondary | ICD-10-CM | POA: Diagnosis not present

## 2019-06-12 ENCOUNTER — Other Ambulatory Visit: Payer: Self-pay | Admitting: Internal Medicine

## 2019-06-12 DIAGNOSIS — Z1231 Encounter for screening mammogram for malignant neoplasm of breast: Secondary | ICD-10-CM

## 2019-06-19 ENCOUNTER — Ambulatory Visit
Admission: RE | Admit: 2019-06-19 | Discharge: 2019-06-19 | Disposition: A | Discharge status: Home / Self Care | Payer: Medicare PPO | Source: Ambulatory Visit | Attending: Internal Medicine | Admitting: Internal Medicine

## 2019-06-19 ENCOUNTER — Other Ambulatory Visit: Payer: Self-pay

## 2019-06-19 DIAGNOSIS — I340 Nonrheumatic mitral (valve) insufficiency: Secondary | ICD-10-CM | POA: Diagnosis not present

## 2019-06-19 DIAGNOSIS — R7303 Prediabetes: Secondary | ICD-10-CM | POA: Diagnosis not present

## 2019-06-19 DIAGNOSIS — Z780 Asymptomatic menopausal state: Secondary | ICD-10-CM | POA: Diagnosis not present

## 2019-06-19 DIAGNOSIS — E039 Hypothyroidism, unspecified: Secondary | ICD-10-CM | POA: Diagnosis not present

## 2019-06-19 DIAGNOSIS — E785 Hyperlipidemia, unspecified: Secondary | ICD-10-CM | POA: Diagnosis not present

## 2019-06-19 DIAGNOSIS — Z1231 Encounter for screening mammogram for malignant neoplasm of breast: Secondary | ICD-10-CM | POA: Diagnosis not present

## 2019-06-19 DIAGNOSIS — I10 Essential (primary) hypertension: Secondary | ICD-10-CM | POA: Diagnosis not present

## 2019-09-12 DIAGNOSIS — E785 Hyperlipidemia, unspecified: Secondary | ICD-10-CM | POA: Diagnosis not present

## 2019-09-12 DIAGNOSIS — E559 Vitamin D deficiency, unspecified: Secondary | ICD-10-CM | POA: Diagnosis not present

## 2019-09-12 DIAGNOSIS — E039 Hypothyroidism, unspecified: Secondary | ICD-10-CM | POA: Diagnosis not present

## 2019-09-12 DIAGNOSIS — R7303 Prediabetes: Secondary | ICD-10-CM | POA: Diagnosis not present

## 2019-09-12 DIAGNOSIS — I10 Essential (primary) hypertension: Secondary | ICD-10-CM | POA: Diagnosis not present

## 2019-10-08 DIAGNOSIS — I10 Essential (primary) hypertension: Secondary | ICD-10-CM | POA: Diagnosis not present

## 2019-10-08 DIAGNOSIS — E039 Hypothyroidism, unspecified: Secondary | ICD-10-CM | POA: Diagnosis not present

## 2019-10-08 DIAGNOSIS — E559 Vitamin D deficiency, unspecified: Secondary | ICD-10-CM | POA: Diagnosis not present

## 2019-10-08 DIAGNOSIS — R7303 Prediabetes: Secondary | ICD-10-CM | POA: Diagnosis not present

## 2019-10-11 DIAGNOSIS — B029 Zoster without complications: Secondary | ICD-10-CM | POA: Diagnosis not present

## 2019-10-11 DIAGNOSIS — R21 Rash and other nonspecific skin eruption: Secondary | ICD-10-CM | POA: Diagnosis not present

## 2019-10-11 DIAGNOSIS — M79641 Pain in right hand: Secondary | ICD-10-CM | POA: Diagnosis not present

## 2019-11-06 DIAGNOSIS — R609 Edema, unspecified: Secondary | ICD-10-CM | POA: Diagnosis not present

## 2019-11-06 DIAGNOSIS — I872 Venous insufficiency (chronic) (peripheral): Secondary | ICD-10-CM | POA: Diagnosis not present

## 2019-11-06 DIAGNOSIS — I87303 Chronic venous hypertension (idiopathic) without complications of bilateral lower extremity: Secondary | ICD-10-CM | POA: Diagnosis not present

## 2019-11-06 DIAGNOSIS — I8393 Asymptomatic varicose veins of bilateral lower extremities: Secondary | ICD-10-CM | POA: Diagnosis not present

## 2020-03-03 DIAGNOSIS — R7303 Prediabetes: Secondary | ICD-10-CM | POA: Diagnosis not present

## 2020-03-03 DIAGNOSIS — E559 Vitamin D deficiency, unspecified: Secondary | ICD-10-CM | POA: Diagnosis not present

## 2020-03-03 DIAGNOSIS — E039 Hypothyroidism, unspecified: Secondary | ICD-10-CM | POA: Diagnosis not present

## 2020-03-03 DIAGNOSIS — E785 Hyperlipidemia, unspecified: Secondary | ICD-10-CM | POA: Diagnosis not present

## 2020-03-03 DIAGNOSIS — I10 Essential (primary) hypertension: Secondary | ICD-10-CM | POA: Diagnosis not present

## 2020-03-04 DIAGNOSIS — E559 Vitamin D deficiency, unspecified: Secondary | ICD-10-CM | POA: Diagnosis not present

## 2020-03-04 DIAGNOSIS — E039 Hypothyroidism, unspecified: Secondary | ICD-10-CM | POA: Diagnosis not present

## 2020-03-04 DIAGNOSIS — R7303 Prediabetes: Secondary | ICD-10-CM | POA: Diagnosis not present

## 2020-03-04 DIAGNOSIS — E785 Hyperlipidemia, unspecified: Secondary | ICD-10-CM | POA: Diagnosis not present

## 2020-03-04 DIAGNOSIS — I10 Essential (primary) hypertension: Secondary | ICD-10-CM | POA: Diagnosis not present

## 2020-03-04 DIAGNOSIS — N390 Urinary tract infection, site not specified: Secondary | ICD-10-CM | POA: Diagnosis not present

## 2020-03-10 DIAGNOSIS — I10 Essential (primary) hypertension: Secondary | ICD-10-CM | POA: Diagnosis not present

## 2020-03-10 DIAGNOSIS — E039 Hypothyroidism, unspecified: Secondary | ICD-10-CM | POA: Diagnosis not present

## 2020-03-10 DIAGNOSIS — E785 Hyperlipidemia, unspecified: Secondary | ICD-10-CM | POA: Diagnosis not present

## 2020-03-10 DIAGNOSIS — E559 Vitamin D deficiency, unspecified: Secondary | ICD-10-CM | POA: Diagnosis not present

## 2020-03-10 DIAGNOSIS — Z0000 Encounter for general adult medical examination without abnormal findings: Secondary | ICD-10-CM | POA: Diagnosis not present

## 2020-03-10 DIAGNOSIS — E119 Type 2 diabetes mellitus without complications: Secondary | ICD-10-CM | POA: Diagnosis not present

## 2020-05-21 DIAGNOSIS — I87303 Chronic venous hypertension (idiopathic) without complications of bilateral lower extremity: Secondary | ICD-10-CM | POA: Diagnosis not present

## 2020-05-21 DIAGNOSIS — I872 Venous insufficiency (chronic) (peripheral): Secondary | ICD-10-CM | POA: Diagnosis not present

## 2020-05-21 DIAGNOSIS — R600 Localized edema: Secondary | ICD-10-CM | POA: Diagnosis not present

## 2020-07-01 DIAGNOSIS — I83891 Varicose veins of right lower extremities with other complications: Secondary | ICD-10-CM | POA: Diagnosis not present

## 2020-07-07 DIAGNOSIS — I10 Essential (primary) hypertension: Secondary | ICD-10-CM | POA: Diagnosis not present

## 2020-07-07 DIAGNOSIS — E785 Hyperlipidemia, unspecified: Secondary | ICD-10-CM | POA: Diagnosis not present

## 2020-07-07 DIAGNOSIS — E039 Hypothyroidism, unspecified: Secondary | ICD-10-CM | POA: Diagnosis not present

## 2020-07-07 DIAGNOSIS — E559 Vitamin D deficiency, unspecified: Secondary | ICD-10-CM | POA: Diagnosis not present

## 2020-07-07 DIAGNOSIS — E119 Type 2 diabetes mellitus without complications: Secondary | ICD-10-CM | POA: Diagnosis not present

## 2020-11-16 DIAGNOSIS — E559 Vitamin D deficiency, unspecified: Secondary | ICD-10-CM | POA: Diagnosis not present

## 2020-11-16 DIAGNOSIS — E119 Type 2 diabetes mellitus without complications: Secondary | ICD-10-CM | POA: Diagnosis not present

## 2020-11-16 DIAGNOSIS — I10 Essential (primary) hypertension: Secondary | ICD-10-CM | POA: Diagnosis not present

## 2020-11-16 DIAGNOSIS — E039 Hypothyroidism, unspecified: Secondary | ICD-10-CM | POA: Diagnosis not present

## 2020-12-08 ENCOUNTER — Other Ambulatory Visit: Payer: Self-pay | Admitting: Family Medicine

## 2020-12-08 DIAGNOSIS — Z1231 Encounter for screening mammogram for malignant neoplasm of breast: Secondary | ICD-10-CM

## 2020-12-11 DIAGNOSIS — E039 Hypothyroidism, unspecified: Secondary | ICD-10-CM | POA: Diagnosis not present

## 2020-12-11 DIAGNOSIS — Z23 Encounter for immunization: Secondary | ICD-10-CM | POA: Diagnosis not present

## 2020-12-11 DIAGNOSIS — E119 Type 2 diabetes mellitus without complications: Secondary | ICD-10-CM | POA: Diagnosis not present

## 2020-12-11 DIAGNOSIS — I10 Essential (primary) hypertension: Secondary | ICD-10-CM | POA: Diagnosis not present

## 2020-12-11 DIAGNOSIS — E785 Hyperlipidemia, unspecified: Secondary | ICD-10-CM | POA: Diagnosis not present

## 2020-12-11 DIAGNOSIS — E559 Vitamin D deficiency, unspecified: Secondary | ICD-10-CM | POA: Diagnosis not present

## 2020-12-18 ENCOUNTER — Ambulatory Visit: Payer: Medicare PPO

## 2021-01-26 ENCOUNTER — Ambulatory Visit
Admission: RE | Admit: 2021-01-26 | Discharge: 2021-01-26 | Disposition: A | Discharge status: Home / Self Care | Payer: Medicare PPO | Source: Ambulatory Visit | Attending: Family Medicine | Admitting: Family Medicine

## 2021-01-26 DIAGNOSIS — Z1231 Encounter for screening mammogram for malignant neoplasm of breast: Secondary | ICD-10-CM | POA: Diagnosis not present

## 2021-04-07 DIAGNOSIS — E039 Hypothyroidism, unspecified: Secondary | ICD-10-CM | POA: Diagnosis not present

## 2021-04-07 DIAGNOSIS — E559 Vitamin D deficiency, unspecified: Secondary | ICD-10-CM | POA: Diagnosis not present

## 2021-04-07 DIAGNOSIS — I10 Essential (primary) hypertension: Secondary | ICD-10-CM | POA: Diagnosis not present

## 2021-04-07 DIAGNOSIS — E785 Hyperlipidemia, unspecified: Secondary | ICD-10-CM | POA: Diagnosis not present

## 2021-04-07 DIAGNOSIS — E119 Type 2 diabetes mellitus without complications: Secondary | ICD-10-CM | POA: Diagnosis not present

## 2021-05-25 DIAGNOSIS — E039 Hypothyroidism, unspecified: Secondary | ICD-10-CM | POA: Diagnosis not present

## 2021-05-25 DIAGNOSIS — E559 Vitamin D deficiency, unspecified: Secondary | ICD-10-CM | POA: Diagnosis not present

## 2021-05-25 DIAGNOSIS — E785 Hyperlipidemia, unspecified: Secondary | ICD-10-CM | POA: Diagnosis not present

## 2021-05-25 DIAGNOSIS — E119 Type 2 diabetes mellitus without complications: Secondary | ICD-10-CM | POA: Diagnosis not present

## 2021-05-25 DIAGNOSIS — Z0000 Encounter for general adult medical examination without abnormal findings: Secondary | ICD-10-CM | POA: Diagnosis not present

## 2021-05-25 DIAGNOSIS — Z23 Encounter for immunization: Secondary | ICD-10-CM | POA: Diagnosis not present

## 2021-05-25 DIAGNOSIS — I10 Essential (primary) hypertension: Secondary | ICD-10-CM | POA: Diagnosis not present

## 2021-05-25 DIAGNOSIS — M25561 Pain in right knee: Secondary | ICD-10-CM | POA: Diagnosis not present

## 2021-06-23 IMAGING — MG DIGITAL SCREENING BILAT W/ TOMO W/ CAD
6 of 10 series · 6 of 30 positions shown · non-contrast
Comparison: Previous exam(s).

CLINICAL DATA: Screening.

EXAM:
DIGITAL SCREENING BILATERAL MAMMOGRAM WITH TOMO AND CAD

[L MLO synth-2D]
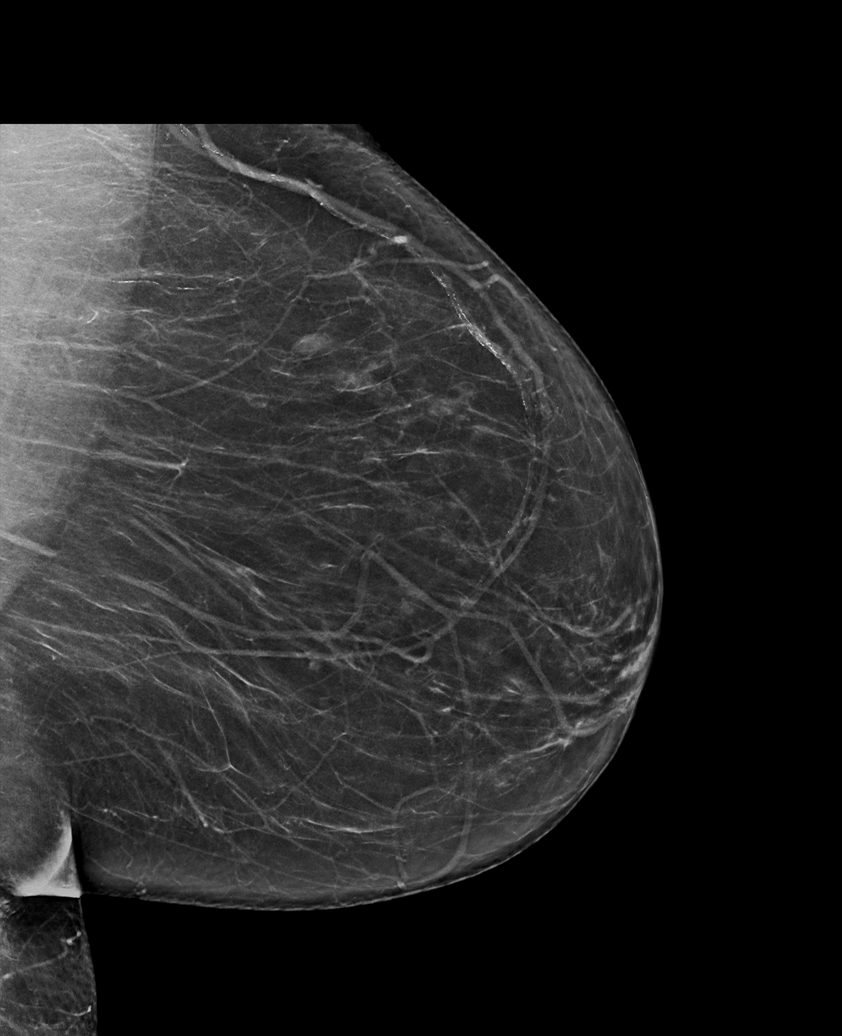

[R MLO synth-2D]
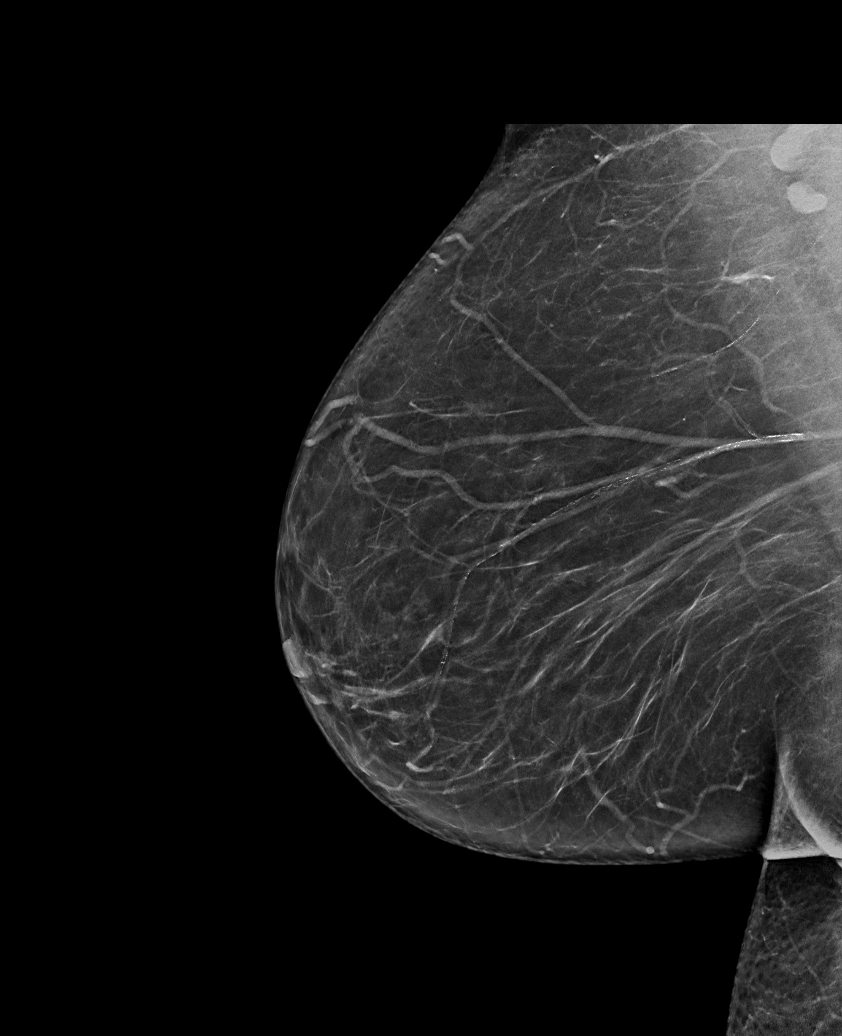

[L CC synth-2D]
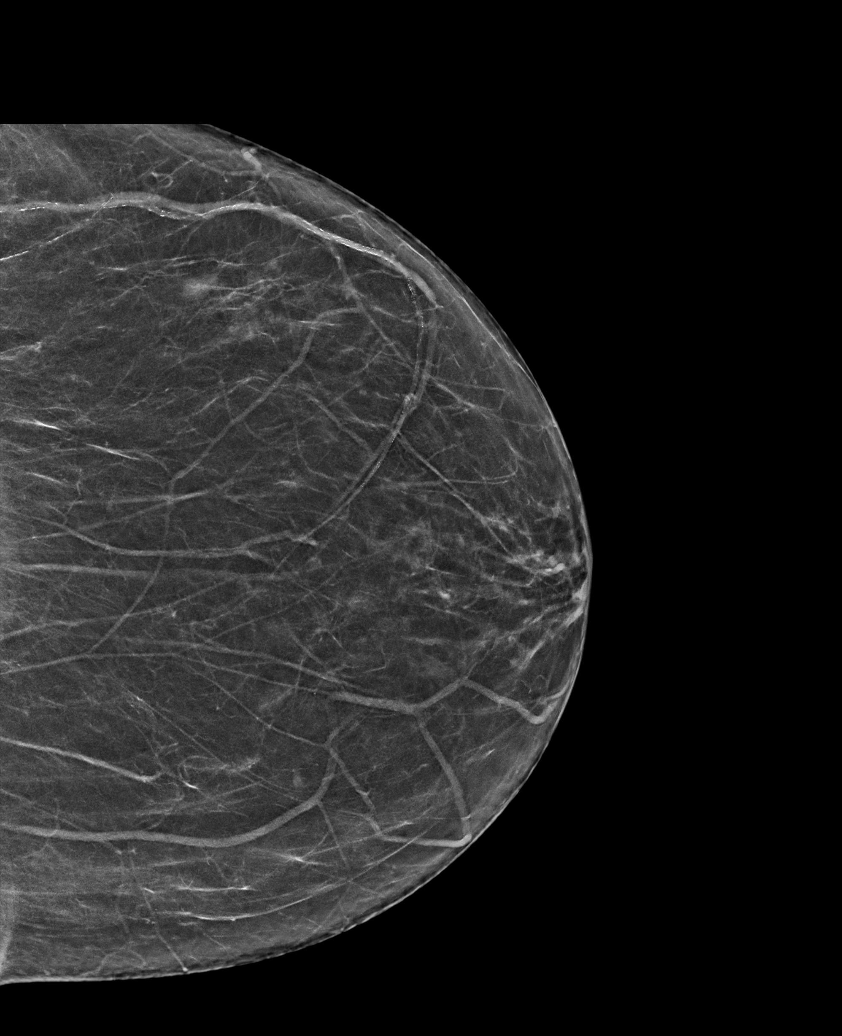

[R CC synth-2D]
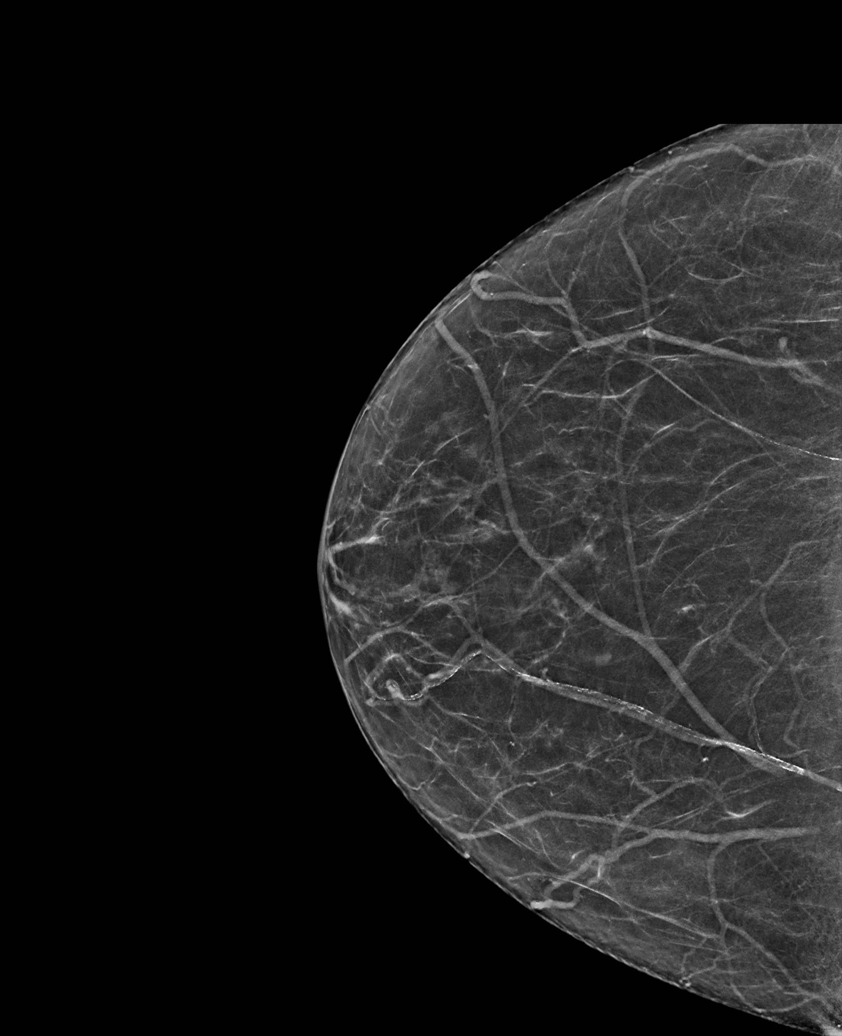

[R CV synth-2D]
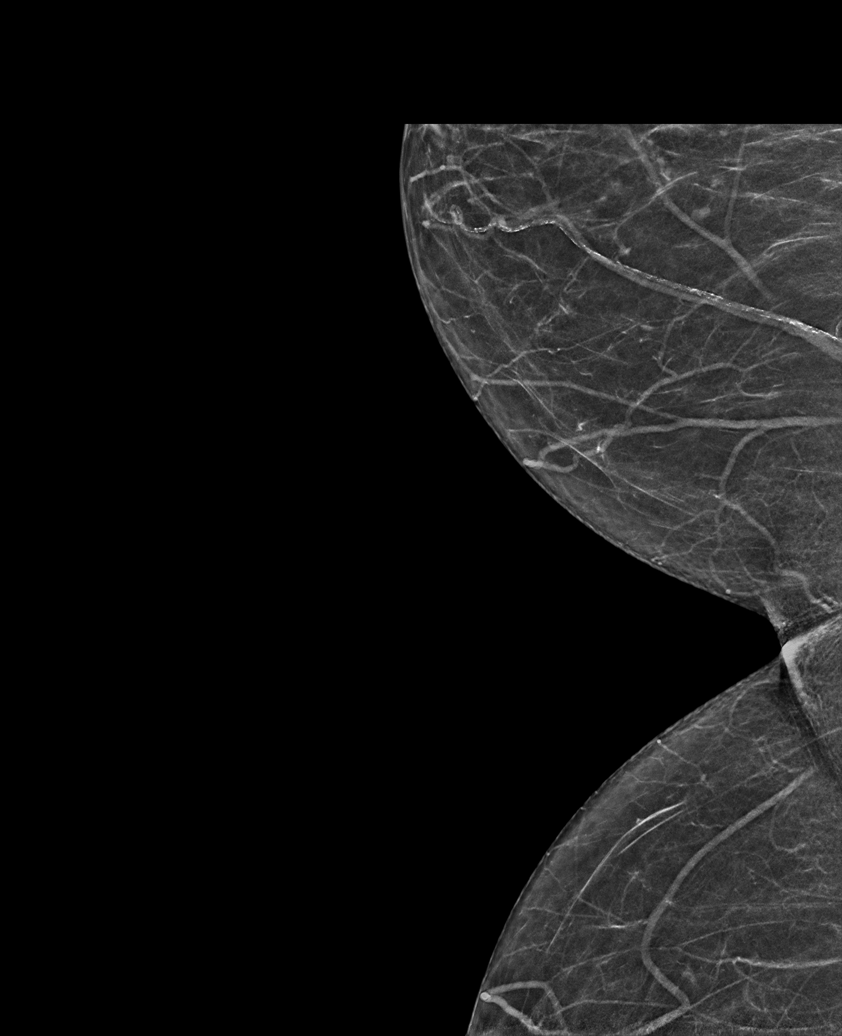

[R CV tomo · tomo slice 28/55.0]
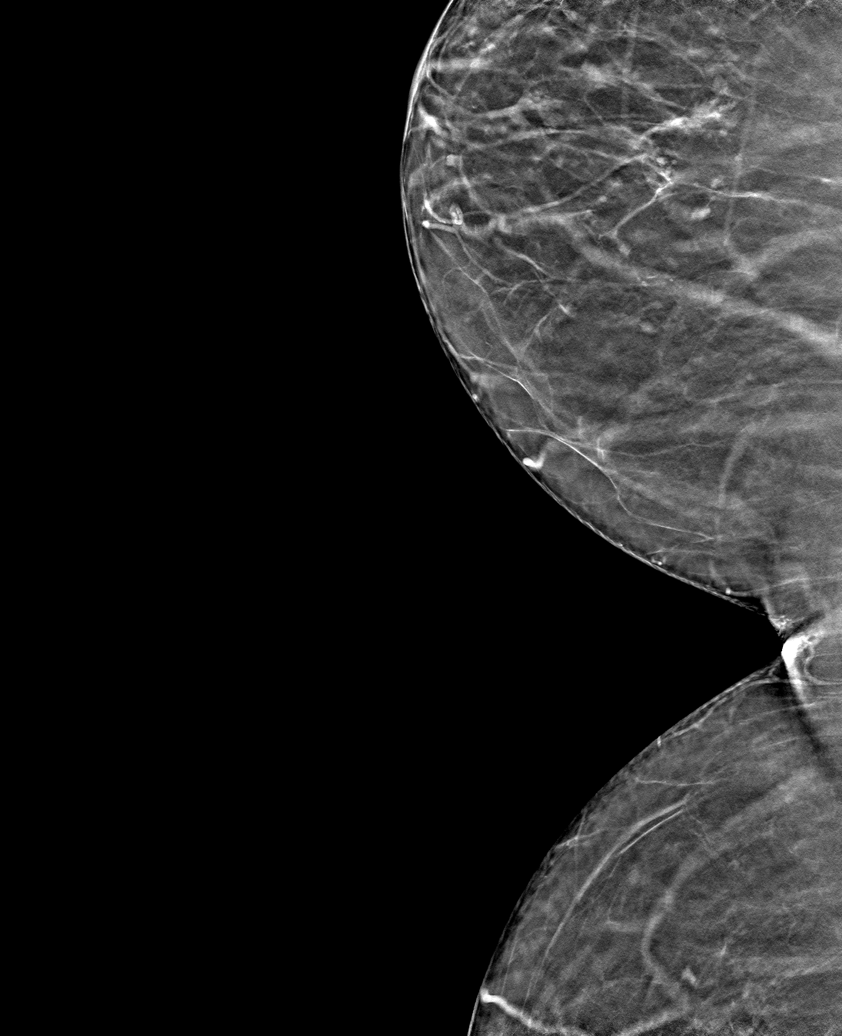

[6 of 30 positions shown; findings below may reference images not displayed]

ACR Breast Density Category b: There are scattered areas of
fibroglandular density.
FINDINGS: There are no findings suspicious for malignancy. Images were
processed with CAD.
IMPRESSION: No mammographic evidence of malignancy. A result letter of this
screening mammogram will be mailed directly to the patient.

RECOMMENDATION:
Screening mammogram in one year. (Code:CN-[id number])

BI-RADS CATEGORY  1: Negative.

## 2021-09-02 DIAGNOSIS — M25561 Pain in right knee: Secondary | ICD-10-CM | POA: Diagnosis not present

## 2021-09-21 DIAGNOSIS — E785 Hyperlipidemia, unspecified: Secondary | ICD-10-CM | POA: Diagnosis not present

## 2021-09-21 DIAGNOSIS — E039 Hypothyroidism, unspecified: Secondary | ICD-10-CM | POA: Diagnosis not present

## 2021-09-21 DIAGNOSIS — I10 Essential (primary) hypertension: Secondary | ICD-10-CM | POA: Diagnosis not present

## 2021-09-21 DIAGNOSIS — E119 Type 2 diabetes mellitus without complications: Secondary | ICD-10-CM | POA: Diagnosis not present

## 2021-10-18 DIAGNOSIS — Z5320 Procedure and treatment not carried out because of patient's decision for unspecified reasons: Secondary | ICD-10-CM | POA: Diagnosis not present

## 2021-10-18 DIAGNOSIS — E039 Hypothyroidism, unspecified: Secondary | ICD-10-CM | POA: Diagnosis not present

## 2021-10-18 DIAGNOSIS — R519 Headache, unspecified: Secondary | ICD-10-CM | POA: Diagnosis not present

## 2021-10-18 DIAGNOSIS — I872 Venous insufficiency (chronic) (peripheral): Secondary | ICD-10-CM | POA: Diagnosis not present

## 2021-10-18 DIAGNOSIS — I10 Essential (primary) hypertension: Secondary | ICD-10-CM | POA: Diagnosis not present

## 2021-10-18 DIAGNOSIS — E785 Hyperlipidemia, unspecified: Secondary | ICD-10-CM | POA: Diagnosis not present

## 2021-10-18 DIAGNOSIS — E118 Type 2 diabetes mellitus with unspecified complications: Secondary | ICD-10-CM | POA: Diagnosis not present

## 2021-10-18 DIAGNOSIS — R0989 Other specified symptoms and signs involving the circulatory and respiratory systems: Secondary | ICD-10-CM | POA: Diagnosis not present

## 2021-10-26 ENCOUNTER — Other Ambulatory Visit: Payer: Self-pay | Admitting: Family Medicine

## 2021-10-26 ENCOUNTER — Other Ambulatory Visit (HOSPITAL_COMMUNITY): Payer: Self-pay | Admitting: Family Medicine

## 2021-10-26 DIAGNOSIS — E785 Hyperlipidemia, unspecified: Secondary | ICD-10-CM

## 2021-10-26 DIAGNOSIS — E118 Type 2 diabetes mellitus with unspecified complications: Secondary | ICD-10-CM

## 2021-10-26 DIAGNOSIS — R519 Headache, unspecified: Secondary | ICD-10-CM

## 2021-10-26 DIAGNOSIS — R0989 Other specified symptoms and signs involving the circulatory and respiratory systems: Secondary | ICD-10-CM

## 2021-11-04 ENCOUNTER — Ambulatory Visit (HOSPITAL_COMMUNITY)
Admission: RE | Admit: 2021-11-04 | Discharge: 2021-11-04 | Disposition: A | Discharge status: Home / Self Care | Payer: Medicare PPO | Source: Ambulatory Visit | Attending: Family Medicine | Admitting: Family Medicine

## 2021-11-04 DIAGNOSIS — E782 Mixed hyperlipidemia: Secondary | ICD-10-CM | POA: Diagnosis not present

## 2021-11-04 DIAGNOSIS — E119 Type 2 diabetes mellitus without complications: Secondary | ICD-10-CM | POA: Diagnosis not present

## 2021-11-04 DIAGNOSIS — R0989 Other specified symptoms and signs involving the circulatory and respiratory systems: Secondary | ICD-10-CM

## 2021-11-04 DIAGNOSIS — R519 Headache, unspecified: Secondary | ICD-10-CM | POA: Diagnosis not present

## 2021-11-04 DIAGNOSIS — I6502 Occlusion and stenosis of left vertebral artery: Secondary | ICD-10-CM | POA: Insufficient documentation

## 2021-11-04 DIAGNOSIS — E785 Hyperlipidemia, unspecified: Secondary | ICD-10-CM

## 2021-11-04 DIAGNOSIS — E118 Type 2 diabetes mellitus with unspecified complications: Secondary | ICD-10-CM

## 2021-11-04 DIAGNOSIS — I6523 Occlusion and stenosis of bilateral carotid arteries: Secondary | ICD-10-CM | POA: Diagnosis not present

## 2021-11-04 MED ORDER — GADOPICLENOL 0.5 MMOL/ML IV SOLN
10.0000 mL | Freq: Once | INTRAVENOUS | Status: AC | PRN
  Administered 2021-11-04: 10 mL via INTRAVENOUS

## 2021-11-29 ENCOUNTER — Other Ambulatory Visit (HOSPITAL_COMMUNITY): Payer: Self-pay | Admitting: Surgery

## 2021-11-29 ENCOUNTER — Encounter: Payer: Self-pay | Admitting: Surgery

## 2021-11-29 ENCOUNTER — Ambulatory Visit (HOSPITAL_COMMUNITY)
Admission: RE | Admit: 2021-11-29 | Discharge: 2021-11-29 | Disposition: A | Discharge status: Home / Self Care | Payer: Medicare PPO | Source: Ambulatory Visit | Attending: Surgery | Admitting: Surgery

## 2021-11-29 ENCOUNTER — Ambulatory Visit: Payer: Medicare PPO | Admitting: Surgery

## 2021-11-29 VITALS — BP 196/74 | HR 61 | Temp 98.3°F | Resp 20 | Ht 63.5 in | Wt 228.0 lb

## 2021-11-29 DIAGNOSIS — I6523 Occlusion and stenosis of bilateral carotid arteries: Secondary | ICD-10-CM

## 2021-11-29 DIAGNOSIS — I779 Disorder of arteries and arterioles, unspecified: Secondary | ICD-10-CM | POA: Insufficient documentation

## 2021-11-29 NOTE — Progress Notes (Signed)
[facility] [place]  Patient name: Jessica Atkinson MRN: [id number] DOB: [birth year] Sex: female   REQUESTING PROVIDER:    Dr. Thomasena Edis    REASON FOR CONSULT:    Carotid disease  HISTORY OF PRESENT ILLNESS:   Jessica Atkinson is a 76 y.o. female, who is referred for further evaluation of carotid disease.  The patient reports having both a right sided and a left-sided headache that was very severe.  When she opened her eyes, she had vision loss.  She did not report any other localizing symptoms.  She has a known right carotid bruit.  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  The patient is a non-smoker.  She is medically managed for hypertension.  She takes a statin for hypercholesterolemia.  She is on a baby aspirin.  PAST MEDICAL HISTORY    Past Medical History:  Diagnosis Date   Arthritis    Complication of anesthesia    hard to wake up after anethesia   Hypertension    Varicose vein of leg      FAMILY HISTORY   History reviewed. No pertinent family history.  SOCIAL HISTORY:   Social History   Socioeconomic History   Marital status: Widowed    Spouse name: Not on file   Number of children: Not on file   Years of education: Not on file   Highest education level: Not on file  Occupational History   Not on file  Tobacco Use   Smoking status: Never   Smokeless tobacco: Never  Substance and Sexual Activity   Alcohol use: No   Drug use: No   Sexual activity: Not on file  Other Topics Concern   Not on file  Social History Narrative   Not on file   Social Determinants of Health   Financial Resource Strain: Not on file  Food Insecurity: Not on file  Transportation Needs: Not on file  Physical Activity: Not on file  Stress: Not on file  Social Connections: Not on file  Intimate Partner Violence: Not on file    ALLERGIES:    No  Known Allergies  CURRENT MEDICATIONS:    Current Outpatient Medications  Medication Sig Dispense Refill   acetaminophen (TYLENOL) 500 MG tablet Take 1 tablet (500 mg total) by mouth every 6 (six) hours as needed for moderate pain. 30 tablet 0   amLODipine (NORVASC) 5 MG tablet Take 5 mg by mouth daily.     aspirin EC 81 MG tablet Take 81 mg by mouth daily.     b complex vitamins tablet Take 1 tablet by mouth daily.     Cholecalciferol (VITAMIN D-3 PO) Take 500 Units by mouth daily.     Cod Liver Oil OIL Take 2 mLs by mouth daily.     fluticasone (FLONASE ALLERGY RELIEF) 50 MCG/ACT nasal spray 2spray in each nostril Nasally Once a day for 30 days     folic acid (FOLVITE) 400 MCG tablet Take 400 mcg by mouth daily.     furosemide (LASIX) 20 MG tablet Take 20 mg by mouth daily as needed.     gabapentin (NEURONTIN) 100 MG capsule Take by mouth.     levothyroxine (SYNTHROID) 50 MCG tablet TAKE 1 TABLET IN THE MORNING ON AN EMPTY STOMACH ONCE A DAY FOR THYROID ORALLY 30 DAYS 90 for 90     loratadine (CLARITIN) 10 MG tablet 1 tablet  Orally Once a day for 30 day(s)     montelukast (SINGULAIR) 10 MG tablet 1 tablet Orally Once a day for 90     Multiple Vitamin (MULTIVITAMIN WITH MINERALS) TABS tablet Take 1 tablet by mouth daily.     olmesartan (BENICAR) 40 MG tablet Take 40 mg by mouth daily.     Omega-3 Fatty Acids (FISH OIL) 1200 MG CAPS Take 1,200 mg by mouth daily.     rosuvastatin (CRESTOR) 5 MG tablet Take 5 mg by mouth daily.     No current facility-administered medications for this visit.    REVIEW OF SYSTEMS:   [X]  denotes positive finding, [ ]  denotes negative finding Cardiac  Comments:  Chest pain or chest pressure:    Shortness of breath upon exertion:    Short of breath when lying flat:    Irregular heart rhythm:        Vascular    Pain in calf, thigh, or hip brought on by ambulation:    Pain in feet at night that wakes you up from your sleep:     Blood clot in your  veins:    Leg swelling:         Pulmonary    Oxygen at home:    Productive cough:     Wheezing:         Neurologic    Sudden weakness in arms or legs:     Sudden numbness in arms or legs:     Sudden onset of difficulty speaking or slurred speech:    Temporary loss of vision in one eye:     Problems with dizziness:         Gastrointestinal    Blood in stool:      Vomited blood:         Genitourinary    Burning when urinating:     Blood in urine:        Psychiatric    Major depression:         Hematologic    Bleeding problems:    Problems with blood clotting too easily:        Skin    Rashes or ulcers:        Constitutional    Fever or chills:     PHYSICAL EXAM:   Vitals:   11/29/21 1339 11/29/21 1341  BP: (!) 187/77 (!) 196/74  Pulse: 61   Resp: 20   Temp: 98.3 F (36.8 C)   SpO2: 99%   Weight: 228 lb (103.4 kg)   Height: 5' 3.5" (1.613 m)     GENERAL: The patient is a well-nourished female, in no acute distress. The vital signs are documented above. CARDIAC: There is a regular rate and rhythm.  VASCULAR: Palpable pedal pulses PULMONARY: Nonlabored respirations MUSCULOSKELETAL: There are no major deformities or cyanosis. NEUROLOGIC: No focal weakness or paresthesias are detected. SKIN: There are no ulcers or rashes noted. PSYCHIATRIC: The patient has a normal affect.  STUDIES:   I reviewed the MRI with the following findings:  1. Carotid atherosclerosis without evidence of a significant common carotid or internal carotid artery stenosis. 2. 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 3. Potentially severe right and moderate left vertebral artery origin stenoses.    CAROTID DUPLEX Right Carotid: Velocities in the right ICA are consistent with a 1-39%  stenosis.                 The ECA appears >50% stenosed.  Left Carotid: Velocities in the left ICA are consistent with a 1-39%  stenosis.   Vertebrals:  Bilateral vertebral arteries  demonstrate antegrade flow.  Elevated               velocities at bilateral vertebral artery origins, right >  left.  Subclavians: Right subclavian artery flow was disturbed. Normal flow               hemodynamics were seen in the left subclavian artery.  ASSESSMENT and PLAN   Carotid disease: The MRI suggests external carotid stenosis.  I ordered a carotid duplex here in the office which confirms no evidence of intracranial carotid disease.  No carotid intervention is recommended.  She should continue with optimal medical therapy.  The patient did have possible vertebral artery stenosis on her MRI.  This would also be treated medically.  Because this was not definitive on MRI, I will get a CT angiogram of her neck when she returns to see me in approximately 6 months.  Headaches: The patient describes what sounds like migraine headaches with aura.  I am making a referral to neurology to better evaluate this.   Leia Alf, MD, FACS [facility] [phone] Pager [phone]

## 2021-12-03 ENCOUNTER — Other Ambulatory Visit: Payer: Self-pay

## 2021-12-03 DIAGNOSIS — I6523 Occlusion and stenosis of bilateral carotid arteries: Secondary | ICD-10-CM

## 2022-01-12 ENCOUNTER — Encounter: Payer: Self-pay | Admitting: Neurology

## 2022-01-12 ENCOUNTER — Ambulatory Visit: Payer: Medicare PPO | Admitting: Neurology

## 2022-01-12 VITALS — BP 189/63 | HR 65 | Ht 64.0 in | Wt 231.6 lb

## 2022-01-12 DIAGNOSIS — R519 Headache, unspecified: Secondary | ICD-10-CM | POA: Diagnosis not present

## 2022-01-12 DIAGNOSIS — R51 Headache with orthostatic component, not elsewhere classified: Secondary | ICD-10-CM

## 2022-01-12 DIAGNOSIS — R5383 Other fatigue: Secondary | ICD-10-CM | POA: Diagnosis not present

## 2022-01-12 DIAGNOSIS — G441 Vascular headache, not elsewhere classified: Secondary | ICD-10-CM

## 2022-01-12 DIAGNOSIS — R400 Somnolence: Secondary | ICD-10-CM

## 2022-01-12 NOTE — Progress Notes (Signed)
Epworth Sleepiness Scale 0= would never doze 1= slight chance of dozing 2= moderate chance of dozing 3= high chance of dozing  Sitting and reading: 3 Watching TV: 2 Sitting inactive in a public place (ex. Theater or meeting): 2 As a passenger in a car for an hour without a break: 3 Lying down to rest in the afternoon: 3 Sitting and talking to someone: 0 Sitting quietly after lunch (no alcohol): 1 In a car, while stopped in traffic: 0 Total: 14

## 2022-01-12 NOTE — Patient Instructions (Signed)
MRI of the brain One lab Evaluation with sleep doctor  Sleep Apnea Sleep apnea is a condition in which breathing pauses or becomes shallow during sleep. People with sleep apnea usually snore loudly. They may have times when they gasp and stop breathing for 10 seconds or more during sleep. This may happen many times during the night. Sleep apnea disrupts your sleep and keeps your body from getting the rest that it needs. This condition can increase your risk of certain health problems, including: Heart attack. Stroke. Obesity. Type 2 diabetes. Heart failure. Irregular heartbeat. High blood pressure. The goal of treatment is to help you breathe normally again. What are the causes?  The most common cause of sleep apnea is a collapsed or blocked airway. There are three kinds of sleep apnea: Obstructive sleep apnea. This kind is caused by a blocked or collapsed airway. Central sleep apnea. This kind happens when the part of the brain that controls breathing does not send the correct signals to the muscles that control breathing. Mixed sleep apnea. This is a combination of obstructive and central sleep apnea. What increases the risk? You are more likely to develop this condition if you: Are overweight. Smoke. Have a smaller than normal airway. Are older. Are female. Drink alcohol. Take sedatives or tranquilizers. Have a family history of sleep apnea. Have a tongue or tonsils that are larger than normal. What are the signs or symptoms? Symptoms of this condition include: Trouble staying asleep. Loud snoring. Morning headaches. Waking up gasping. Dry mouth or sore throat in the morning. Daytime sleepiness and tiredness. If you have daytime fatigue because of sleep apnea, you may be more likely to have: Trouble concentrating. Forgetfulness. Irritability or mood swings. Personality changes. Feelings of depression. Sexual dysfunction. This may include loss of interest if you are  female, or erectile dysfunction if you are female. How is this diagnosed? This condition may be diagnosed with: A medical history. A physical exam. A series of tests that are done while you are sleeping (sleep study). These tests are usually done in a sleep lab, but they may also be done at home. How is this treated? Treatment for this condition aims to restore normal breathing and to ease symptoms during sleep. It may involve managing health issues that can affect breathing, such as high blood pressure or obesity. Treatment may include: Sleeping on your side. Using a decongestant if you have nasal congestion. Avoiding the use of depressants, including alcohol, sedatives, and narcotics. Losing weight if you are overweight. Making changes to your diet. Quitting smoking. Using a device to open your airway while you sleep, such as: An oral appliance. This is a custom-made mouthpiece that shifts your lower jaw forward. A continuous positive airway pressure (CPAP) device. This device blows air through a mask when you breathe out (exhale). A nasal expiratory positive airway pressure (EPAP) device. This device has valves that you put into each nostril. A bi-level positive airway pressure (BIPAP) device. This device blows air through a mask when you breathe in (inhale) and breathe out (exhale). Having surgery if other treatments do not work. During surgery, excess tissue is removed to create a wider airway. Follow these instructions at home: Lifestyle Make any lifestyle changes that your health care provider recommends. Eat a healthy, well-balanced diet. Take steps to lose weight if you are overweight. Avoid using depressants, including alcohol, sedatives, and narcotics. Do not use any products that contain nicotine or tobacco. These products include cigarettes, chewing tobacco, and  vaping devices, such as e-cigarettes. If you need help quitting, ask your health care provider. General  instructions Take over-the-counter and prescription medicines only as told by your health care provider. If you were given a device to open your airway while you sleep, use it only as told by your health care provider. If you are having surgery, make sure to tell your health care provider you have sleep apnea. You may need to bring your device with you. Keep all follow-up visits. This is important. Contact a health care provider if: The device that you received to open your airway during sleep is uncomfortable or does not seem to be working. Your symptoms do not improve. Your symptoms get worse. Get help right away if: You develop: Chest pain. Shortness of breath. Discomfort in your back, arms, or stomach. You have: Trouble speaking. Weakness on one side of your body. Drooping in your face. These symptoms may represent a serious problem that is an emergency. Do not wait to see if the symptoms will go away. Get medical help right away. Call your local emergency services (911 in the [place] Do not drive yourself to the hospital. Summary Sleep apnea is a condition in which breathing pauses or becomes shallow during sleep. The most common cause is a collapsed or blocked airway. The goal of treatment is to restore normal breathing and to ease symptoms during sleep. This information is not intended to replace advice given to you by your health care provider. Make sure you discuss any questions you have with your health care provider. Document Revised: 09/02/2020 Document Reviewed: 01/03/2020 Elsevier Patient Education  2023 [employer].

## 2022-01-12 NOTE — Progress Notes (Signed)
[facility]    Provider:  Dr Lucia Gaskins Requesting Provider: Nada Libman, MD Primary Care Provider:  Irena Reichmann, DO  CC:  morning headache  HPI:  Jessica Atkinson is a 76 y.o. female here as requested by Nada Libman, MD for headaches. She is not sure why she is here. She went to bed and woke up 2 months ago and she had a pain on the right side of her head, not around the eye (points to the parietal area) severe, worst pain of her life, she got down on her knees it was so bad. 3 weeks later she had same episode but on the left side. Pulsating, pounding, throbbing, it woke her up out of bed both times, it lasted briefly after she got up, she opened her eyes and she could not see it was very dark and it was light outside, she cannot explain feeling except it was severe, no light sensitivity, loss of consciousness, nausea, both eyes lost vision briefly after the pain past. Sleep helped. Unclear what trigered it. She still work part time. Raising 2 of her grandchildren. Hasn't happened again. No unilatral autonomic symptoms. She wakes frequently to use the bathroom. She is very fatigued during the day if she sir she nap. She wakes up with dry mouth. No other focal neurologic deficits, associated symptoms, inciting events or modifiable factors.  Reviewed notes, labs and imaging from outside physicians, which showed:  MRI brain: IMPRESSION:  Negative brain MRI.  No findings to explain dizziness.    Electronically Signed    By: Tiburcio Pea M.D.    On: 05/03/2013 21:40   MRA head: IMPRESSION: 1. Carotid atherosclerosis without evidence of a significant common carotid or internal carotid artery stenosis. 2. 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 3. Potentially severe right and moderate left vertebral artery origin stenoses.     Electronically Signed   By: Sebastian Ache M.D.   On: 11/07/2021 12:58   Review of Systems: Patient complains of symptoms  per HPI as well as the following symptoms headache. Pertinent negatives and positives per HPI. All others negative.   Social History   Socioeconomic History   Marital status: Widowed    Spouse name: Not on file   Number of children: Not on file   Years of education: Not on file   Highest education level: Not on file  Occupational History   Not on file  Tobacco Use   Smoking status: Never   Smokeless tobacco: Never  Substance and Sexual Activity   Alcohol use: No   Drug use: No   Sexual activity: Not on file  Other Topics Concern   Not on file  Social History Narrative   Not on file   Social Determinants of Health   Financial Resource Strain: Not on file  Food Insecurity: Not on file  Transportation Needs: Not on file  Physical Activity: Not on file  Stress: Not on file  Social Connections: Not on file  Intimate Partner Violence: Not on file    Family History  Problem Relation Age of Onset   Migraines Grandchild     Past Medical History:  Diagnosis Date   Arthritis    Complication of anesthesia    hard to wake up after anethesia   Hypertension    Varicose vein of leg     Patient Active Problem List   Diagnosis Date Noted   Morning headache 01/12/2022    Past Surgical History:  Procedure Laterality Date   APPENDECTOMY     CATARACT EXTRACTION, BILATERAL     HYSTEROSCOPY WITH D & C N/A 09/11/2013   Procedure: DILATATION AND CURETTAGE /HYSTEROSCOPY;  Surgeon: Robley Fries, MD;  Location: [facility];  Service: Gynecology;  Laterality: N/A;   TUBAL LIGATION     WISDOM TOOTH EXTRACTION      Current Outpatient Medications  Medication Sig Dispense Refill   acetaminophen (TYLENOL) 500 MG tablet Take 1 tablet (500 mg total) by mouth every 6 (six) hours as needed for moderate pain. 30 tablet 0   amLODipine (NORVASC) 5 MG tablet Take 5 mg by mouth daily.     aspirin EC 81 MG tablet Take 81 mg by mouth daily.     b complex vitamins tablet Take 1 tablet by mouth  daily.     Cholecalciferol (VITAMIN D-3 PO) Take 500 Units by mouth daily.     Cod Liver Oil OIL Take 2 mLs by mouth daily.     folic acid (FOLVITE) 400 MCG tablet Take 400 mcg by mouth daily.     furosemide (LASIX) 20 MG tablet Take 20 mg by mouth daily as needed.     levothyroxine (SYNTHROID) 50 MCG tablet TAKE 1 TABLET IN THE MORNING ON AN EMPTY STOMACH ONCE A DAY FOR THYROID ORALLY 30 DAYS 90 for 90     Multiple Vitamin (MULTIVITAMIN WITH MINERALS) TABS tablet Take 1 tablet by mouth daily.     olmesartan (BENICAR) 40 MG tablet Take 40 mg by mouth daily.     Omega-3 Fatty Acids (FISH OIL) 1200 MG CAPS Take 1,200 mg by mouth daily.     rosuvastatin (CRESTOR) 5 MG tablet Take 5 mg by mouth daily.     No current facility-administered medications for this visit.    Allergies as of 01/12/2022   (No Known Allergies)    Vitals: BP (!) 189/63   Pulse 65   Ht 5\' 4"  (1.626 m)   Wt 231 lb 9.6 oz (105.1 kg)   BMI 39.75 kg/m  Last Weight:  Wt Readings from Last 1 Encounters:  01/12/22 231 lb 9.6 oz (105.1 kg)   Last Height:   Ht Readings from Last 1 Encounters:  01/12/22 5\' 4"  (1.626 m)     Physical exam: Exam: Gen: NAD, conversant, well nourised, obese, well groomed                     CV: RRR, no MRG. No Carotid Bruits. No peripheral edema, warm, nontender Eyes: Conjunctivae clear without exudates or hemorrhage  Neuro: Detailed Neurologic Exam  Speech:    Speech is normal; fluent and spontaneous with normal comprehension.  Cognition:    The patient is oriented to person, place, and time;     recent and remote memory intact;     language fluent;     normal attention, concentration,     fund of knowledge Cranial Nerves:    The pupils are equal, round, and reactive to light. Pupils too small to visualize fundi attempted. Visual fields are full. Extraocular movements are intact. Trigeminal sensation is intact and the muscles of mastication are normal. The face is symmetric.  The palate elevates in the midline. Hearing intact. Voice is normal. Shoulder shrug is normal. The tongue has normal motion without fasciculations.   Coordination: nml  Gait: antalgic  Motor Observation:    No asymmetry, no atrophy, and no involuntary movements noted. Tone:    Normal muscle tone.  Posture:    Posture is normal. normal erect    Strength:    Strength is without focal deficits in the upper and lower limbs.      Sensation: intact to LT     Reflex Exam:  DTR's:    Deep tendon reflexes in the upper and lower extremities are symmetrical bilaterally.   Toes:    The toes are downgoing bilaterally.   Clonus:    Clonus is absent.    Assessment/Plan:  Patient with worst headache of life on waking. She wakes frequently to use the bathroom. She is very fatigued during the day. She wakes up with dry mouth. ESS 14. MRA without etiology.  MRI of the brain w/wo contrast: MRI brain due to concerning symptoms of morning headaches, positional and nocturnal headaches,worst headache of life, new onset headache in the elderly to look for space occupying mass, chiari or intracranial hypertension (pseudotumor), strokes, malignancies, vasculidities, demyelination(multiple sclerosis) or other  She may have sleep apnea: Patient with worst headache of life on waking. She wakes frequently to use the bathroom. She is very fatigued during the day. She wakes up with dry mouth. ESS 14.sleep evaluation.  Orders Placed This Encounter  Procedures   MR BRAIN W WO CONTRAST   Basic Metabolic Panel   Ambulatory referral to Sleep Studies   Cc: Nada Libman, MD,  Irena Reichmann, DO  Naomie Dean, MD  [facility] [address] [place], [place] [address]  Phone [phone] Fax [phone]

## 2022-01-13 LAB — BASIC METABOLIC PANEL
BUN/Creatinine Ratio: 16 (ref 12–28)
BUN: 14 mg/dL (ref 8–27)
CO2: 24 mmol/L (ref 20–29)
Calcium: 9.5 mg/dL (ref 8.7–10.3)
Chloride: 106 mmol/L (ref 96–106)
Creatinine, Ser: 0.88 mg/dL (ref 0.57–1.00)
Glucose: 90 mg/dL (ref 70–99)
Potassium: 4.5 mmol/L (ref 3.5–5.2)
Sodium: 143 mmol/L (ref 134–144)
eGFR: 67 mL/min/{1.73_m2} (ref >59)

## 2022-01-14 ENCOUNTER — Telehealth: Payer: Self-pay | Admitting: Neurology

## 2022-01-14 NOTE — Telephone Encounter (Signed)
Sent to [facility] for scheduling.  [employer] MCR Berkley Harvey: [phone] exp. 01/14/22-02/13/22

## 2022-01-17 ENCOUNTER — Telehealth: Payer: Self-pay | Admitting: *Deleted

## 2022-01-17 NOTE — Telephone Encounter (Signed)
Spoke to patient gave lab results . Pt expressed understanding and thanked me for calling  

## 2022-01-19 ENCOUNTER — Other Ambulatory Visit: Payer: Self-pay | Admitting: Family Medicine

## 2022-01-19 DIAGNOSIS — Z1231 Encounter for screening mammogram for malignant neoplasm of breast: Secondary | ICD-10-CM

## 2022-02-11 DIAGNOSIS — I872 Venous insufficiency (chronic) (peripheral): Secondary | ICD-10-CM | POA: Diagnosis not present

## 2022-02-11 DIAGNOSIS — E039 Hypothyroidism, unspecified: Secondary | ICD-10-CM | POA: Diagnosis not present

## 2022-02-11 DIAGNOSIS — I10 Essential (primary) hypertension: Secondary | ICD-10-CM | POA: Diagnosis not present

## 2022-02-11 DIAGNOSIS — R0989 Other specified symptoms and signs involving the circulatory and respiratory systems: Secondary | ICD-10-CM | POA: Diagnosis not present

## 2022-02-11 DIAGNOSIS — Z5320 Procedure and treatment not carried out because of patient's decision for unspecified reasons: Secondary | ICD-10-CM | POA: Diagnosis not present

## 2022-02-11 DIAGNOSIS — R519 Headache, unspecified: Secondary | ICD-10-CM | POA: Diagnosis not present

## 2022-02-11 DIAGNOSIS — E785 Hyperlipidemia, unspecified: Secondary | ICD-10-CM | POA: Diagnosis not present

## 2022-02-11 DIAGNOSIS — E118 Type 2 diabetes mellitus with unspecified complications: Secondary | ICD-10-CM | POA: Diagnosis not present

## 2022-02-18 DIAGNOSIS — I340 Nonrheumatic mitral (valve) insufficiency: Secondary | ICD-10-CM | POA: Diagnosis not present

## 2022-02-18 DIAGNOSIS — I872 Venous insufficiency (chronic) (peripheral): Secondary | ICD-10-CM | POA: Diagnosis not present

## 2022-02-18 DIAGNOSIS — E785 Hyperlipidemia, unspecified: Secondary | ICD-10-CM | POA: Diagnosis not present

## 2022-02-18 DIAGNOSIS — I10 Essential (primary) hypertension: Secondary | ICD-10-CM | POA: Diagnosis not present

## 2022-02-18 DIAGNOSIS — I6529 Occlusion and stenosis of unspecified carotid artery: Secondary | ICD-10-CM | POA: Diagnosis not present

## 2022-02-18 DIAGNOSIS — E039 Hypothyroidism, unspecified: Secondary | ICD-10-CM | POA: Diagnosis not present

## 2022-02-18 DIAGNOSIS — E118 Type 2 diabetes mellitus with unspecified complications: Secondary | ICD-10-CM | POA: Diagnosis not present

## 2022-02-18 DIAGNOSIS — R0989 Other specified symptoms and signs involving the circulatory and respiratory systems: Secondary | ICD-10-CM | POA: Diagnosis not present

## 2022-02-18 DIAGNOSIS — Z23 Encounter for immunization: Secondary | ICD-10-CM | POA: Diagnosis not present

## 2022-03-17 ENCOUNTER — Ambulatory Visit
Admission: RE | Admit: 2022-03-17 | Discharge: 2022-03-17 | Disposition: A | Discharge status: Home / Self Care | Payer: Medicare PPO | Source: Ambulatory Visit | Attending: Family Medicine | Admitting: Family Medicine

## 2022-03-17 DIAGNOSIS — Z1231 Encounter for screening mammogram for malignant neoplasm of breast: Secondary | ICD-10-CM

## 2022-04-28 ENCOUNTER — Other Ambulatory Visit: Payer: Self-pay

## 2022-04-28 DIAGNOSIS — I6523 Occlusion and stenosis of bilateral carotid arteries: Secondary | ICD-10-CM

## 2022-05-23 ENCOUNTER — Ambulatory Visit (HOSPITAL_COMMUNITY): Payer: Medicare PPO

## 2022-05-30 ENCOUNTER — Ambulatory Visit: Payer: Medicare PPO | Admitting: Surgery

## 2022-06-14 DIAGNOSIS — E039 Hypothyroidism, unspecified: Secondary | ICD-10-CM | POA: Diagnosis not present

## 2022-06-14 DIAGNOSIS — E785 Hyperlipidemia, unspecified: Secondary | ICD-10-CM | POA: Diagnosis not present

## 2022-06-14 DIAGNOSIS — E118 Type 2 diabetes mellitus with unspecified complications: Secondary | ICD-10-CM | POA: Diagnosis not present

## 2022-06-14 DIAGNOSIS — E559 Vitamin D deficiency, unspecified: Secondary | ICD-10-CM | POA: Diagnosis not present

## 2022-06-14 DIAGNOSIS — I10 Essential (primary) hypertension: Secondary | ICD-10-CM | POA: Diagnosis not present

## 2022-06-28 DIAGNOSIS — E118 Type 2 diabetes mellitus with unspecified complications: Secondary | ICD-10-CM | POA: Diagnosis not present

## 2022-06-28 DIAGNOSIS — Z0000 Encounter for general adult medical examination without abnormal findings: Secondary | ICD-10-CM | POA: Diagnosis not present

## 2022-06-28 DIAGNOSIS — Z23 Encounter for immunization: Secondary | ICD-10-CM | POA: Diagnosis not present

## 2022-06-28 DIAGNOSIS — I10 Essential (primary) hypertension: Secondary | ICD-10-CM | POA: Diagnosis not present

## 2022-06-28 DIAGNOSIS — E039 Hypothyroidism, unspecified: Secondary | ICD-10-CM | POA: Diagnosis not present

## 2022-06-28 DIAGNOSIS — Z9109 Other allergy status, other than to drugs and biological substances: Secondary | ICD-10-CM | POA: Diagnosis not present

## 2022-11-01 DIAGNOSIS — E118 Type 2 diabetes mellitus with unspecified complications: Secondary | ICD-10-CM | POA: Diagnosis not present

## 2022-11-01 DIAGNOSIS — E039 Hypothyroidism, unspecified: Secondary | ICD-10-CM | POA: Diagnosis not present

## 2022-11-01 DIAGNOSIS — I10 Essential (primary) hypertension: Secondary | ICD-10-CM | POA: Diagnosis not present

## 2022-11-01 DIAGNOSIS — Z780 Asymptomatic menopausal state: Secondary | ICD-10-CM | POA: Diagnosis not present

## 2022-11-09 DIAGNOSIS — E785 Hyperlipidemia, unspecified: Secondary | ICD-10-CM | POA: Diagnosis not present

## 2022-11-09 DIAGNOSIS — I10 Essential (primary) hypertension: Secondary | ICD-10-CM | POA: Diagnosis not present

## 2022-11-09 DIAGNOSIS — E118 Type 2 diabetes mellitus with unspecified complications: Secondary | ICD-10-CM | POA: Diagnosis not present

## 2022-11-09 DIAGNOSIS — E039 Hypothyroidism, unspecified: Secondary | ICD-10-CM | POA: Diagnosis not present

## 2022-11-09 DIAGNOSIS — Z23 Encounter for immunization: Secondary | ICD-10-CM | POA: Diagnosis not present

## 2023-05-01 DIAGNOSIS — I10 Essential (primary) hypertension: Secondary | ICD-10-CM | POA: Diagnosis not present

## 2023-05-01 DIAGNOSIS — E039 Hypothyroidism, unspecified: Secondary | ICD-10-CM | POA: Diagnosis not present

## 2023-05-01 DIAGNOSIS — E785 Hyperlipidemia, unspecified: Secondary | ICD-10-CM | POA: Diagnosis not present

## 2023-05-01 DIAGNOSIS — E118 Type 2 diabetes mellitus with unspecified complications: Secondary | ICD-10-CM | POA: Diagnosis not present

## 2023-08-09 ENCOUNTER — Other Ambulatory Visit: Payer: Self-pay | Admitting: Family Medicine

## 2023-08-09 DIAGNOSIS — Z1231 Encounter for screening mammogram for malignant neoplasm of breast: Secondary | ICD-10-CM

## 2023-08-10 ENCOUNTER — Ambulatory Visit
Admission: RE | Admit: 2023-08-10 | Discharge: 2023-08-10 | Disposition: A | Discharge status: Home / Self Care | Source: Ambulatory Visit | Attending: Family Medicine | Admitting: Family Medicine

## 2023-08-10 DIAGNOSIS — Z1231 Encounter for screening mammogram for malignant neoplasm of breast: Secondary | ICD-10-CM | POA: Diagnosis not present
